# Patient Record
Sex: Male | Born: 1985
Health system: Southern US, Community
[De-identification: ages and names within clinical notes are randomized; demographics above are authoritative.]

## PROBLEM LIST (undated history)

## (undated) HISTORY — PX: HERNIA REPAIR: SHX51

## (undated) HISTORY — PX: ORIF TIBIA FRACTURE: SHX5416

---

## 2011-12-22 ENCOUNTER — Emergency Department (HOSPITAL_BASED_OUTPATIENT_CLINIC_OR_DEPARTMENT_OTHER)
Admission: EM | Admit: 2011-12-22 | Discharge: 2011-12-22 | Disposition: A | Discharge status: Home / Self Care | Payer: Self-pay | Attending: Emergency Medicine | Admitting: Emergency Medicine

## 2011-12-22 ENCOUNTER — Encounter (HOSPITAL_BASED_OUTPATIENT_CLINIC_OR_DEPARTMENT_OTHER): Payer: Self-pay | Admitting: *Deleted

## 2011-12-22 DIAGNOSIS — L089 Local infection of the skin and subcutaneous tissue, unspecified: Secondary | ICD-10-CM | POA: Insufficient documentation

## 2011-12-22 DIAGNOSIS — B86 Scabies: Secondary | ICD-10-CM | POA: Insufficient documentation

## 2011-12-22 DIAGNOSIS — F17200 Nicotine dependence, unspecified, uncomplicated: Secondary | ICD-10-CM | POA: Insufficient documentation

## 2011-12-22 MED ORDER — HYDROCODONE-ACETAMINOPHEN 5-325 MG PO TABS: 2.0000 | ORAL_TABLET | ORAL | Status: DC | PRN

## 2011-12-22 MED ORDER — DOXYCYCLINE HYCLATE 100 MG PO CAPS: 100.0000 mg | ORAL_CAPSULE | Freq: Two times a day (BID) | ORAL | Status: DC

## 2011-12-22 MED ORDER — DOXYCYCLINE HYCLATE 100 MG PO CAPS: 100.0000 mg | ORAL_CAPSULE | Freq: Two times a day (BID) | ORAL | Status: AC

## 2011-12-22 MED ORDER — SULFAMETHOXAZOLE-TRIMETHOPRIM 800-160 MG PO TABS: 1.0000 | ORAL_TABLET | Freq: Two times a day (BID) | ORAL | Status: DC

## 2011-12-22 MED ORDER — PERMETHRIN 5 % EX CREA: TOPICAL_CREAM | CUTANEOUS | Status: AC

## 2011-12-22 NOTE — ED Provider Notes (Signed)
History     CSN: [id number]  Arrival date & time 12/22/11  1102   First MD Initiated Contact with Patient 12/22/11 1227      Chief Complaint  Patient presents with  . Insect Bite  . Rash    (Consider location/radiation/quality/duration/timing/severity/associated sxs/prior treatment) Patient is a 26 y.o. male presenting with rash. The history is provided by the patient. No language interpreter was used.  Rash  This is a new problem. The current episode started more than 2 days ago. The problem has not changed since onset.The problem is associated with nothing. There has been no fever. The rash is present on the neck. The pain is at a severity of 6/10. The pain is moderate. The pain has been constant since onset. Associated symptoms include blisters and itching. He has tried antihistamines and anti-itch cream for the symptoms. The treatment provided mild relief.    History reviewed. No pertinent past medical history.  History reviewed. No pertinent past surgical history.  History reviewed. No pertinent family history.  History  Substance Use Topics  . Smoking status: Current Everyday Smoker  . Smokeless tobacco: Not on file  . Alcohol Use: Yes     occ      Review of Systems  Skin: Positive for itching and rash.  All other systems reviewed and are negative.    Allergies  Review of patient's allergies indicates no known allergies.  Home Medications   Current Outpatient Rx  Name Route Sig Dispense Refill  . DOXYCYCLINE HYCLATE 100 MG PO CAPS Oral Take 1 capsule (100 mg total) by mouth 2 (two) times daily. 20 capsule 0  . HYDROCODONE-ACETAMINOPHEN 5-325 MG PO TABS Oral Take 2 tablets by mouth every 4 (four) hours as needed for pain. 16 tablet 0  . PERMETHRIN 5 % EX CREA  Apply to affected area once 60 g 0    BP 123/70  Pulse 65  Temp(Src) 97.8 F (36.6 C) (Oral)  Resp 20  Ht 5\' 7"  (1.702 m)  Wt 154 lb 7 oz (70.052 kg)  BMI 24.19 kg/m2  SpO2 100%  Physical  Exam  Nursing note and vitals reviewed. Constitutional: He is oriented to person, place, and time. He appears well-developed and well-nourished.  HENT:  Head: Normocephalic and atraumatic.  Right Ear: External ear normal.  Left Ear: External ear normal.  Nose: Nose normal.  Mouth/Throat: Oropharynx is clear and moist.  Eyes: Conjunctivae and EOM are normal. Pupils are equal, round, and reactive to light.  Neck: Normal range of motion. Neck supple.  Cardiovascular: Normal rate.   Pulmonary/Chest: Effort normal.  Abdominal: Soft.  Musculoskeletal: Normal range of motion.  Neurological: He is alert and oriented to person, place, and time.  Skin: There is erythema.  Psychiatric: He has a normal mood and affect.    ED Course  Procedures (including critical care time)  Labs Reviewed - No data to display No results found.   1. Scabies   2. Skin infection       MDM  Pt given rx for elemite, hydrocodone and bactrim        Langston Masker, [place] 12/22/11 1248

## 2011-12-22 NOTE — Discharge Instructions (Signed)
Scabies Scabies are small bugs (mites) that burrow under the skin and cause red bumps and severe itching. These bugs can only be seen with a microscope. Scabies are highly contagious. They can spread easily from person to person by direct contact. They are also spread through sharing clothing or linens that have the scabies mites living in them. It is not unusual for an entire family to become infected through shared towels, clothing, or bedding.  HOME CARE INSTRUCTIONS   Your caregiver may prescribe a cream or lotion to kill the mites. If this cream is prescribed; massage the cream into the entire area of the body from the neck to the bottom of both feet. Also massage the cream into the scalp and face if your child is less than 13 year old. Avoid the eyes and mouth.   Leave the cream on for 8 to12 hours. Do not wash your hands after application. Your child should bathe or shower after the 8 to 12 hour application period. Sometimes it is helpful to apply the cream to your child at right before bedtime.   One treatment is usually effective and will eliminate approximately 95% of infestations. For severe cases, your caregiver may decide to repeat the treatment in 1 week. Everyone in your household should be treated with one application of the cream.   New rashes or burrows should not appear after successful treatment within 24 to 48 hours; however the itching and rash may last for 2 to 4 weeks after successful treatment. If your symptoms persist longer than this, see your caregiver.   Your caregiver also may prescribe a medication to help with the itching or to help the rash go away more quickly.   Scabies can live on clothing or linens for up to 3 days. Your entire child's recently used clothing, towels, stuffed toys, and bed linens should be washed in hot water and then dried in a dryer for at least 20 minutes on high heat. Items that cannot be washed should be enclosed in a plastic bag for at least 3  days.   To help relieve itching, bathe your child in a cool bath or apply cool washcloths to the affected areas.   Your child may return to school after treatment with the prescribed cream.  SEEK MEDICAL CARE IF:   The itching persists longer than 4 weeks after treatment.   The rash spreads or becomes infected (the area has red blisters or yellow-tan crust).  Document Released: 10/18/2005 Document Revised: 06/30/2011 Document Reviewed: 02/26/2009 [facility] Patient Information 2012 [place], [place].Skin Infections A skin infection usually develops as a result of disruption of the skin barrier.  CAUSES  A skin infection might occur following:  Trauma or an injury to the skin such as a cut or insect sting.   Inflammation (as in eczema).   Breaks in the skin between the toes (as in athlete's foot).   Swelling (edema).  SYMPTOMS  The legs are the most common site affected. Usually there is:  Redness.   Swelling.   Pain.   There may be red streaks in the area of the infection.  TREATMENT   Minor skin infections may be treated with topical antibiotics, but if the skin infection is severe, hospital care and intravenous (IV) antibiotic treatment may be needed.   Most often skin infections can be treated with oral antibiotic medicine as well as proper rest and elevation of the affected area until the infection improves.   If you are  prescribed oral antibiotics, it is important to take them as directed and to take all the pills even if you feel better before you have finished all of the medicine.   You may apply warm compresses to the area for 20-30 minutes 4 times daily.  You might need a tetanus shot now if:  You have no idea when you had the last one.   You have never had a tetanus shot before.   Your wound had dirt in it.  If you need a tetanus shot and you decide not to get one, there is a rare chance of getting tetanus. Sickness from tetanus can be serious. If you get a  tetanus shot, your arm may swell and become red and warm at the shot site. This is common and not a problem. SEEK MEDICAL CARE IF:  The pain and swelling from your infection do not improve within 2 days.  SEEK IMMEDIATE MEDICAL CARE IF:  You develop a fever, chills, or other serious problems.  Document Released: 11/25/2004 Document Revised: 06/30/2011 Document Reviewed: 10/07/2008 [facility] Patient Information 2012 [place], [place].

## 2011-12-22 NOTE — ED Notes (Signed)
Swollen area on left side of neck no drainage is painful also has red rash that itches on arms and legs x 2.5 weeks

## 2011-12-22 NOTE — ED Notes (Signed)
Pt asking about the wait to be seen by physician. Offered blankets and tv  Explained the delay assured pt he will be seen soon

## 2011-12-23 NOTE — ED Provider Notes (Signed)
History/physical exam/procedure(s) were performed by non-physician practitioner and as supervising physician I was immediately available for consultation/collaboration. I have reviewed all notes and am in agreement with care and plan.   Hilario Quarry, MD 12/23/11 1257

## 2011-12-27 ENCOUNTER — Encounter (HOSPITAL_BASED_OUTPATIENT_CLINIC_OR_DEPARTMENT_OTHER): Payer: Self-pay | Admitting: *Deleted

## 2011-12-27 ENCOUNTER — Emergency Department (HOSPITAL_BASED_OUTPATIENT_CLINIC_OR_DEPARTMENT_OTHER)
Admission: EM | Admit: 2011-12-27 | Discharge: 2011-12-27 | Disposition: A | Discharge status: Home / Self Care | Payer: Self-pay | Attending: Emergency Medicine | Admitting: Emergency Medicine

## 2011-12-27 DIAGNOSIS — L03221 Cellulitis of neck: Secondary | ICD-10-CM | POA: Insufficient documentation

## 2011-12-27 DIAGNOSIS — R591 Generalized enlarged lymph nodes: Secondary | ICD-10-CM

## 2011-12-27 DIAGNOSIS — R599 Enlarged lymph nodes, unspecified: Secondary | ICD-10-CM | POA: Insufficient documentation

## 2011-12-27 DIAGNOSIS — L0211 Cutaneous abscess of neck: Secondary | ICD-10-CM | POA: Insufficient documentation

## 2011-12-27 DIAGNOSIS — M542 Cervicalgia: Secondary | ICD-10-CM | POA: Insufficient documentation

## 2011-12-27 DIAGNOSIS — L0291 Cutaneous abscess, unspecified: Secondary | ICD-10-CM

## 2011-12-27 DIAGNOSIS — R220 Localized swelling, mass and lump, head: Secondary | ICD-10-CM | POA: Insufficient documentation

## 2011-12-27 MED ORDER — LIDOCAINE-EPINEPHRINE 2 %-1:100000 IJ SOLN
INTRAMUSCULAR | Status: AC
  Filled 2011-12-27: qty 1

## 2011-12-27 MED ORDER — OXYCODONE-ACETAMINOPHEN 5-325 MG PO TABS: 2.0000 | ORAL_TABLET | Freq: Once | ORAL | Status: AC

## 2011-12-27 MED ORDER — DOXYCYCLINE MONOHYDRATE 100 MG PO CAPS: 100.0000 mg | ORAL_CAPSULE | Freq: Two times a day (BID) | ORAL | Status: AC

## 2011-12-27 MED ORDER — DOXYCYCLINE MONOHYDRATE 100 MG PO CAPS: 100.0000 mg | ORAL_CAPSULE | Freq: Two times a day (BID) | ORAL | Status: DC

## 2011-12-27 MED ORDER — OXYCODONE-ACETAMINOPHEN 5-325 MG PO TABS: 1.0000 | ORAL_TABLET | ORAL | Status: AC | PRN

## 2011-12-27 MED ORDER — OXYCODONE-ACETAMINOPHEN 5-325 MG PO TABS: 1.0000 | ORAL_TABLET | ORAL | Status: DC | PRN

## 2011-12-27 MED ORDER — OXYCODONE-ACETAMINOPHEN 5-325 MG PO TABS
2.0000 | ORAL_TABLET | Freq: Once | ORAL | Status: AC
  Administered 2011-12-27: 2 via ORAL
  Filled 2011-12-27: qty 2

## 2011-12-27 NOTE — Discharge Instructions (Signed)
Abscess An abscess (boil or furuncle) is an infected area that contains a collection of pus.  SYMPTOMS Signs and symptoms of an abscess include pain, tenderness, redness, or hardness. You may feel a moveable soft area under your skin. An abscess can occur anywhere in the body.  TREATMENT  A surgical cut (incision) may be made over your abscess to drain the pus. Gauze may be packed into the space or a drain may be looped through the abscess cavity (pocket). This provides a drain that will allow the cavity to heal from the inside outwards. The abscess may be painful for a few days, but should feel much better if it was drained.  Your abscess, if seen early, may not have localized and may not have been drained. If not, another appointment may be required if it does not get better on its own or with medications. HOME CARE INSTRUCTIONS   Only take over-the-counter or prescription medicines for pain, discomfort, or fever as directed by your caregiver.   Take your antibiotics as directed if they were prescribed. Finish them even if you start to feel better.   Keep the skin and clothes clean around your abscess.   If the abscess was drained, you will need to use gauze dressing to collect any draining pus. Dressings will typically need to be changed 3 or more times a day.   The infection may spread by skin contact with others. Avoid skin contact as much as possible.   Practice good hygiene. This includes regular hand washing, cover any draining skin lesions, and do not share personal care items.   If you participate in sports, do not share athletic equipment, towels, whirlpools, or personal care items. Shower after every practice or tournament.   If a draining area cannot be adequately covered:   Do not participate in sports.   Children should not participate in day care until the wound has healed or drainage stops.   If your caregiver has given you a follow-up appointment, it is very important  to keep that appointment. Not keeping the appointment could result in a much worse infection, chronic or permanent injury, pain, and disability. If there is any problem keeping the appointment, you must call back to this facility for assistance.  SEEK MEDICAL CARE IF:   You develop increased pain, swelling, redness, drainage, or bleeding in the wound site.   You develop signs of generalized infection including muscle aches, chills, fever, or a general ill feeling.   You have an oral temperature above 102 F (38.9 C).  MAKE SURE YOU:   Understand these instructions.   Will watch your condition.   Will get help right away if you are not doing well or get worse.  Document Released: 07/28/2005 Document Revised: 06/30/2011 Document Reviewed: 05/21/2008 [facility] Patient Information 2012 [place], [place].Lymphadenopathy Lymphadenopathy means "disease of the lymph glands." But the term is usually used to describe swollen or enlarged lymph glands, also called lymph nodes. These are the bean-shaped organs found in many locations including the neck, underarm, and groin. Lymph glands are part of the immune system, which fights infections in your body. Lymphadenopathy can occur in just one area of the body, such as the neck, or it can be generalized, with lymph node enlargement in several areas. The nodes found in the neck are the most common sites of lymphadenopathy. CAUSES  When your immune system responds to germs (such as viruses or bacteria ), infection-fighting cells and fluid build up. This causes  the glands to grow in size. This is usually not something to worry about. Sometimes, the glands themselves can become infected and inflamed. This is called lymphadenitis. Enlarged lymph nodes can be caused by many diseases:  Bacterial disease, such as strep throat or a skin infection.   Viral disease, such as a common cold.   Other germs, such as lyme disease, tuberculosis, or sexually transmitted  diseases.   Cancers, such as lymphoma (cancer of the lymphatic system) or leukemia (cancer of the white blood cells).   Inflammatory diseases such as lupus or rheumatoid arthritis.   Reactions to medications.  Many of the diseases above are rare, but important. This is why you should see your caregiver if you have lymphadenopathy. SYMPTOMS   Swollen, enlarged lumps in the neck, back of the head or other locations.   Tenderness.   Warmth or redness of the skin over the lymph nodes.   Fever.  DIAGNOSIS  Enlarged lymph nodes are often near the source of infection. They can help healthcare providers diagnose your illness. For instance:   Swollen lymph nodes around the jaw might be caused by an infection in the mouth.   Enlarged glands in the neck often signal a throat infection.   Lymph nodes that are swollen in more than one area often indicate an illness caused by a virus.  Your caregiver most likely will know what is causing your lymphadenopathy after listening to your history and examining you. Blood tests, x-rays or other tests may be needed. If the cause of the enlarged lymph node cannot be found, and it does not go away by itself, then a biopsy may be needed. Your caregiver will discuss this with you. TREATMENT  Treatment for your enlarged lymph nodes will depend on the cause. Many times the nodes will shrink to normal size by themselves, with no treatment. Antibiotics or other medicines may be needed for infection. Only take over-the-counter or prescription medicines for pain, discomfort or fever as directed by your caregiver. HOME CARE INSTRUCTIONS  Swollen lymph glands usually return to normal when the underlying medical condition goes away. If they persist, contact your health-care provider. He/she might prescribe antibiotics or other treatments, depending on the diagnosis. Take any medications exactly as prescribed. Keep any follow-up appointments made to check on the condition  of your enlarged nodes.  SEEK MEDICAL CARE IF:   Swelling lasts for more than two weeks.   You have symptoms such as weight loss, night sweats, fatigue or fever that does not go away.   The lymph nodes are hard, seem fixed to the skin or are growing rapidly.   Skin over the lymph nodes is red and inflamed. This could mean there is an infection.  SEEK IMMEDIATE MEDICAL CARE IF:   Fluid starts leaking from the area of the enlarged lymph node.   You develop a fever of 102 F (38.9 C) or greater.   Severe pain develops (not necessarily at the site of a large lymph node).   You develop chest pain or shortness of breath.   You develop worsening abdominal pain.  MAKE SURE YOU:   Understand these instructions.   Will watch your condition.   Will get help right away if you are not doing well or get worse.  Document Released: 07/27/2008 Document Revised: 06/30/2011 Document Reviewed: 07/27/2008 [facility] Patient Information 2012 [place], [place].

## 2011-12-27 NOTE — ED Provider Notes (Addendum)
History     CSN: [id number]  Arrival date & time 12/27/11  2000   First MD Initiated Contact with Patient 12/27/11 2032      No chief complaint on file.   (Consider location/radiation/quality/duration/timing/severity/associated sxs/prior treatment) HPI  History reviewed. No pertinent past medical history. Patient with tender swollen area to left posterior neck.  Patient seen here one week for same and placed on doxycycline.  Patient with increased swelling and pain and noted discharge two days ago.  Patient states he has similar area under left chin which has been present for two years and was drained once.  He has not had fever, chills, fb, numbness or tingling.   Past Surgical History  Procedure Date  . Hernia repair     No family history on file.  History  Substance Use Topics  . Smoking status: Current Everyday Smoker  . Smokeless tobacco: Not on file  . Alcohol Use: Yes     occ      Review of Systems  All other systems reviewed and are negative.    Allergies  Review of patient's allergies indicates no known allergies.  Home Medications   Current Outpatient Rx  Name Route Sig Dispense Refill  . DOXYCYCLINE HYCLATE 100 MG PO CAPS Oral Take 1 capsule (100 mg total) by mouth 2 (two) times daily. 20 capsule 0  . HYDROCODONE-ACETAMINOPHEN 5-325 MG PO TABS Oral Take 2 tablets by mouth every 4 (four) hours as needed. For pain      BP 145/73  Temp(Src) 98.2 F (36.8 C) (Oral)  Resp 16  SpO2 100%  Physical Exam  Vitals reviewed. Constitutional: He is oriented to person, place, and time. He appears well-developed and well-nourished.  HENT:  Head: Normocephalic and atraumatic.  Right Ear: External ear normal.  Left Ear: External ear normal.  Eyes: Pupils are equal, round, and reactive to light.  Neck: Normal range of motion. Neck supple.         Tender swollen 4x 3 cm left posterior neck swelling. 2x2 cm left submandibular node without fluctuance    Cardiovascular: Normal rate.   Pulmonary/Chest: Effort normal.  Abdominal: Soft.  Musculoskeletal: Normal range of motion.  Neurological: He is alert and oriented to person, place, and time.  Skin: Skin is warm and dry.  Psychiatric: He has a normal mood and affect.    ED Course  Procedures (including critical care time)  Labs Reviewed - No data to display No results found.   No diagnosis found.    MDM  INCISION AND DRAINAGE Performed by: Hilario Quarry Consent: Verbal consent obtained. Risks and benefits: risks, benefits and alternatives were discussed Type: abscess  Body area: neck  Anesthesia: local infiltration  Local anesthetic: lidocaine 1% with epinephrine  Anesthetic total: 1 ml  Complexity: complex Blunt dissection to break up loculations  Drainage: purulent  Drainage amount: copious    Patient tolerance: Patient tolerated the procedure well with no immediate complications.         Hilario Quarry, MD 12/27/11 4098  Hilario Quarry, MD 12/27/11 2101

## 2011-12-27 NOTE — ED Notes (Signed)
Was seen here 1 week ago for cyst on his neck and been given abx for cyst on his left neck.  Cyst is worse now and more painful.

## 2013-01-08 ENCOUNTER — Emergency Department (HOSPITAL_BASED_OUTPATIENT_CLINIC_OR_DEPARTMENT_OTHER)
Admission: EM | Admit: 2013-01-08 | Discharge: 2013-01-08 | Disposition: A | Discharge status: Home / Self Care | Payer: Self-pay | Attending: Emergency Medicine | Admitting: Emergency Medicine

## 2013-01-08 ENCOUNTER — Encounter (HOSPITAL_BASED_OUTPATIENT_CLINIC_OR_DEPARTMENT_OTHER): Payer: Self-pay | Admitting: Family Medicine

## 2013-01-08 DIAGNOSIS — K029 Dental caries, unspecified: Secondary | ICD-10-CM | POA: Insufficient documentation

## 2013-01-08 DIAGNOSIS — Z79899 Other long term (current) drug therapy: Secondary | ICD-10-CM | POA: Insufficient documentation

## 2013-01-08 DIAGNOSIS — F17200 Nicotine dependence, unspecified, uncomplicated: Secondary | ICD-10-CM | POA: Insufficient documentation

## 2013-01-08 DIAGNOSIS — K089 Disorder of teeth and supporting structures, unspecified: Secondary | ICD-10-CM | POA: Insufficient documentation

## 2013-01-08 DIAGNOSIS — K047 Periapical abscess without sinus: Secondary | ICD-10-CM | POA: Insufficient documentation

## 2013-01-08 MED ORDER — AMOXICILLIN 500 MG PO CAPS: 500.0000 mg | ORAL_CAPSULE | Freq: Three times a day (TID) | ORAL | Status: DC

## 2013-01-08 MED ORDER — HYDROCODONE-ACETAMINOPHEN 5-325 MG PO TABS: 1.0000 | ORAL_TABLET | Freq: Four times a day (QID) | ORAL | Status: DC | PRN

## 2013-01-08 NOTE — ED Provider Notes (Signed)
History     CSN: [id number]  Arrival date & time 01/08/13  1030   First MD Initiated Contact with Patient 01/08/13 1125      Chief Complaint  Patient presents with  . Dental Pain    (Consider location/radiation/quality/duration/timing/severity/associated sxs/prior treatment) Patient is a 27 y.o. male presenting with tooth pain. The history is provided by the patient.  Dental PainPrimary symptoms do not include headaches, fever or sore throat.  pt c/o left upper dental pain, gum swelling. Pain constant, mod-sev, dull, worse w eating at times. No fever or chills. No trouble breathing or swallowing. No injury to area but states has several bad teeth. No pain or swelling to throat, neck or floor of mouth.     History reviewed. No pertinent past medical history.  Past Surgical History  Procedure Laterality Date  . Hernia repair      No family history on file.  History  Substance Use Topics  . Smoking status: Current Every Day Smoker  . Smokeless tobacco: Not on file  . Alcohol Use: Yes     Comment: occ      Review of Systems  Constitutional: Negative for fever.  HENT: Negative for sore throat.   Gastrointestinal: Negative for nausea and vomiting.  Neurological: Negative for headaches.    Allergies  Review of patient's allergies indicates no known allergies.  Home Medications   Current Outpatient Rx  Name  Route  Sig  Dispense  Refill  . AMOXICILLIN ER PO   Oral   Take by mouth.         . traMADol (ULTRAM) 50 MG tablet   Oral   Take 50 mg by mouth every 6 (six) hours as needed for pain.           BP 121/73  Pulse 59  Temp(Src) 98.2 F (36.8 C) (Oral)  Resp 16  SpO2 100%  Physical Exam  Nursing note and vitals reviewed. Constitutional: He is oriented to person, place, and time. He appears well-developed and well-nourished. No distress.  HENT:  Head: Atraumatic.  Nose: Nose normal.  Mouth/Throat: Oropharynx is clear and moist.  Several  decayed, broken off teeth. Left upper molar tenderness, decay, associated gum swelling. No trismus. No pharyngeal swelling. No swelling or tenderness to floor of mouth.    Eyes: Conjunctivae are normal. No scleral icterus.  Neck: Normal range of motion. Neck supple. No tracheal deviation present.  Cardiovascular: Normal rate.   Pulmonary/Chest: Effort normal. No accessory muscle usage. No respiratory distress.  Musculoskeletal: He exhibits no edema.  Lymphadenopathy:    He has no cervical adenopathy.  Neurological: He is alert and oriented to person, place, and time.  Skin: Skin is warm and dry.  Psychiatric: He has a normal mood and affect.    ED Course  Procedures (including critical care time)     MDM  Pt drove self to ed on scooter.  will give rx for home, discussed dental follow up.  rx norco and amox, confirmed nkda w pt.           Suzi Roots, MD 01/08/13 1140

## 2013-01-08 NOTE — ED Notes (Signed)
Pt c/o dental pain for "a while". Pt was seen and prescribed amoxicillin and tramadol at [facility] ED last week for same.

## 2013-02-26 ENCOUNTER — Emergency Department (HOSPITAL_BASED_OUTPATIENT_CLINIC_OR_DEPARTMENT_OTHER)
Admission: EM | Admit: 2013-02-26 | Discharge: 2013-02-26 | Disposition: A | Discharge status: Home / Self Care | Payer: Self-pay | Attending: Emergency Medicine | Admitting: Emergency Medicine

## 2013-02-26 ENCOUNTER — Encounter (HOSPITAL_BASED_OUTPATIENT_CLINIC_OR_DEPARTMENT_OTHER): Payer: Self-pay | Admitting: *Deleted

## 2013-02-26 DIAGNOSIS — K029 Dental caries, unspecified: Secondary | ICD-10-CM | POA: Insufficient documentation

## 2013-02-26 DIAGNOSIS — K0889 Other specified disorders of teeth and supporting structures: Secondary | ICD-10-CM

## 2013-02-26 DIAGNOSIS — F17200 Nicotine dependence, unspecified, uncomplicated: Secondary | ICD-10-CM | POA: Insufficient documentation

## 2013-02-26 DIAGNOSIS — K089 Disorder of teeth and supporting structures, unspecified: Secondary | ICD-10-CM | POA: Insufficient documentation

## 2013-02-26 MED ORDER — HYDROCODONE-ACETAMINOPHEN 5-325 MG PO TABS: 1.0000 | ORAL_TABLET | ORAL | Status: DC | PRN

## 2013-02-26 MED ORDER — PENICILLIN V POTASSIUM 500 MG PO TABS: 500.0000 mg | ORAL_TABLET | Freq: Three times a day (TID) | ORAL | Status: DC

## 2013-02-26 MED ORDER — IBUPROFEN 800 MG PO TABS: 800.0000 mg | ORAL_TABLET | Freq: Three times a day (TID) | ORAL | Status: DC

## 2013-02-26 NOTE — ED Notes (Signed)
Pt amb to room 11 with quick steady gait in nad. Pt reports 2 days of tooth pain, states he has no insurance and has not called a Education officer, community.

## 2013-02-26 NOTE — ED Provider Notes (Signed)
History     CSN: [id number]  Arrival date & time 02/26/13  1011   First MD Initiated Contact with Patient 02/26/13 1020      Chief Complaint  Patient presents with  . Dental Pain    (Consider location/radiation/quality/duration/timing/severity/associated sxs/prior treatment) Patient is a 27 y.o. male presenting with tooth pain. The history is provided by the patient.  Dental PainThe primary symptoms include mouth pain. Primary symptoms do not include fever.  Additional symptoms do not include: facial swelling. Associated symptoms comments: Recurrent dental pain without new injury or known fracture. No facial swelling or fever. Marland Kitchen    History reviewed. No pertinent past medical history.  Past Surgical History  Procedure Laterality Date  . Hernia repair      History reviewed. No pertinent family history.  History  Substance Use Topics  . Smoking status: Current Every Day Smoker  . Smokeless tobacco: Not on file  . Alcohol Use: Yes     Comment: occ      Review of Systems  Constitutional: Negative for fever.  HENT: Positive for dental problem. Negative for facial swelling.     Allergies  Review of patient's allergies indicates no known allergies.  Home Medications   Current Outpatient Rx  Name  Route  Sig  Dispense  Refill  . amoxicillin (AMOXIL) 500 MG capsule   Oral   Take 1 capsule (500 mg total) by mouth 3 (three) times daily.   21 capsule   0   . AMOXICILLIN ER PO   Oral   Take by mouth.         Marland Kitchen HYDROcodone-acetaminophen (NORCO/VICODIN) 5-325 MG per tablet   Oral   Take 1-2 tablets by mouth every 6 (six) hours as needed for pain.   20 tablet   0   . traMADol (ULTRAM) 50 MG tablet   Oral   Take 50 mg by mouth every 6 (six) hours as needed for pain.           BP 157/84  Pulse 68  Temp(Src) 97.6 F (36.4 C) (Oral)  Resp 18  Ht 5\' 9"  (1.753 m)  Wt 140 lb (63.504 kg)  BMI 20.67 kg/m2  SpO2 100%  Physical Exam  Constitutional: He is  oriented to person, place, and time. He appears well-developed and well-nourished. No distress.  HENT:  Diffuse dental caries with upper right 2nd molar partially missing. No pulp exposure. No facial swelling. No cervical adenopathy.  Pulmonary/Chest: Effort normal.  Neurological: He is alert and oriented to person, place, and time.    ED Course  Procedures (including critical care time)  Labs Reviewed - No data to display No results found.   No diagnosis found.  1. Dental pain   MDM  Recurrent dental pain. He admits to poor dental care and to not taking abx prescribed to him last ED visit (01/08/13) as he was supposed to. He is a smoker. He has not followed up with a dentist for further care. Encouraged dental follow up with discussion on narcotic prescription limitations through ED.        Arnoldo Hooker, PA-C 02/26/13 1037

## 2013-02-27 NOTE — ED Provider Notes (Signed)
Medical screening examination/treatment/procedure(s) were performed by non-physician practitioner and as supervising physician I was immediately available for consultation/collaboration.    Shelda Jakes, MD 02/27/13 [phone]

## 2013-03-01 ENCOUNTER — Emergency Department (HOSPITAL_BASED_OUTPATIENT_CLINIC_OR_DEPARTMENT_OTHER)
Admission: EM | Admit: 2013-03-01 | Discharge: 2013-03-01 | Disposition: A | Discharge status: Home / Self Care | Payer: Self-pay | Attending: Emergency Medicine | Admitting: Emergency Medicine

## 2013-03-01 ENCOUNTER — Encounter (HOSPITAL_BASED_OUTPATIENT_CLINIC_OR_DEPARTMENT_OTHER): Payer: Self-pay | Admitting: Emergency Medicine

## 2013-03-01 DIAGNOSIS — F17200 Nicotine dependence, unspecified, uncomplicated: Secondary | ICD-10-CM | POA: Insufficient documentation

## 2013-03-01 DIAGNOSIS — K047 Periapical abscess without sinus: Secondary | ICD-10-CM | POA: Insufficient documentation

## 2013-03-01 MED ORDER — BUPIVACAINE-EPINEPHRINE PF 0.5-1:200000 % IJ SOLN
3.0000 mL | Freq: Once | INTRAMUSCULAR | Status: AC
  Administered 2013-03-01: 15 mg
  Filled 2013-03-01: qty 3.6

## 2013-03-01 MED ORDER — OXYCODONE-ACETAMINOPHEN 10-325 MG PO TABS: 1.0000 | ORAL_TABLET | ORAL | Status: DC | PRN

## 2013-03-01 MED ORDER — BUPIVACAINE-EPINEPHRINE (PF) 0.5% -1:200000 IJ SOLN
INTRAMUSCULAR | Status: AC
  Administered 2013-03-01: 15 mg
  Filled 2013-03-01: qty 1.8

## 2013-03-01 MED ORDER — BUPIVACAINE-EPINEPHRINE (PF) 0.5% -1:200000 IJ SOLN
INTRAMUSCULAR | Status: AC
  Filled 2013-03-01: qty 1.8

## 2013-03-01 NOTE — ED Notes (Signed)
Pt c/o dental pain. Pt was seen here for same a few weeks ago. Pt states the hydrocodone we gave him isn't helping. Pt states "it's like eating candy".

## 2013-03-01 NOTE — ED Notes (Signed)
Advised pt of going to [facility] for their clinic. Pt states he has heard of this but hasn't had time to go.

## 2013-03-01 NOTE — ED Provider Notes (Addendum)
History     CSN: [id number]  Arrival date & time 03/01/13  7829      Chief Complaint  Patient presents with  . Dental Pain    (Consider location/radiation/quality/duration/timing/severity/associated sxs/prior treatment) HPI This is a 27 year old male with a history of widespread dental decay. This is second visitof the week for dental pain. Since his last visit he states he is been compliant with taking his amoxicillin and hydrocodone but has had worsening pain despite taking these. He is complaining specifically of pain at the right upper first bicuspid. He states the pain is severe, worse with palpation of the gum, eating or drinking. He states the adjacent gum is swollen and he can see a "sore" on the gum. He states he intends to do for the dentures this morning except for 35 walk-in's after 6:30 AM.  History reviewed. No pertinent past medical history.  Past Surgical History  Procedure Laterality Date  . Hernia repair      No family history on file.  History  Substance Use Topics  . Smoking status: Current Every Day Smoker  . Smokeless tobacco: Not on file  . Alcohol Use: Yes     Comment: occ      Review of Systems  All other systems reviewed and are negative.    Allergies  Review of patient's allergies indicates no known allergies.  Home Medications   Current Outpatient Rx  Name  Route  Sig  Dispense  Refill  . amoxicillin (AMOXIL) 500 MG capsule   Oral   Take 1 capsule (500 mg total) by mouth 3 (three) times daily.   21 capsule   0   . AMOXICILLIN ER PO   Oral   Take by mouth.         Marland Kitchen HYDROcodone-acetaminophen (NORCO/VICODIN) 5-325 MG per tablet   Oral   Take 1-2 tablets by mouth every 6 (six) hours as needed for pain.   20 tablet   0   . HYDROcodone-acetaminophen (NORCO/VICODIN) 5-325 MG per tablet   Oral   Take 1 tablet by mouth every 4 (four) hours as needed for pain.   8 tablet   0   . ibuprofen (ADVIL,MOTRIN) 800 MG tablet   Oral   Take 1 tablet (800 mg total) by mouth 3 (three) times daily.   21 tablet   0   . penicillin v potassium (VEETID) 500 MG tablet   Oral   Take 1 tablet (500 mg total) by mouth 3 (three) times daily.   30 tablet   0   . traMADol (ULTRAM) 50 MG tablet   Oral   Take 50 mg by mouth every 6 (six) hours as needed for pain.           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 Adequate analgesia was obtained. The patient tolerated this well and there were no immediate complications.    MDM  The patient was told he would receive a prescription for 6 oxycodone tablets in case he is unable to get in until tomorrow. He was advised that what he needs is definitive dental treatment, not repeated visits to the ED.        Hanley Seamen, MD 03/01/13 1610  Hanley Seamen, MD 03/01/13 9604  Hanley Seamen, MD 03/01/13 5409

## 2013-10-12 ENCOUNTER — Emergency Department (HOSPITAL_BASED_OUTPATIENT_CLINIC_OR_DEPARTMENT_OTHER)
Admission: EM | Admit: 2013-10-12 | Discharge: 2013-10-12 | Disposition: A | Discharge status: Home / Self Care | Payer: Self-pay | Attending: Emergency Medicine | Admitting: Emergency Medicine

## 2013-10-12 ENCOUNTER — Encounter (HOSPITAL_BASED_OUTPATIENT_CLINIC_OR_DEPARTMENT_OTHER): Payer: Self-pay | Admitting: Emergency Medicine

## 2013-10-12 DIAGNOSIS — F17200 Nicotine dependence, unspecified, uncomplicated: Secondary | ICD-10-CM | POA: Insufficient documentation

## 2013-10-12 DIAGNOSIS — Z792 Long term (current) use of antibiotics: Secondary | ICD-10-CM | POA: Insufficient documentation

## 2013-10-12 DIAGNOSIS — K0889 Other specified disorders of teeth and supporting structures: Secondary | ICD-10-CM

## 2013-10-12 DIAGNOSIS — K089 Disorder of teeth and supporting structures, unspecified: Secondary | ICD-10-CM | POA: Insufficient documentation

## 2013-10-12 DIAGNOSIS — K08109 Complete loss of teeth, unspecified cause, unspecified class: Secondary | ICD-10-CM | POA: Insufficient documentation

## 2013-10-12 DIAGNOSIS — R51 Headache: Secondary | ICD-10-CM | POA: Insufficient documentation

## 2013-10-12 DIAGNOSIS — K5900 Constipation, unspecified: Secondary | ICD-10-CM | POA: Insufficient documentation

## 2013-10-12 DIAGNOSIS — Z791 Long term (current) use of non-steroidal anti-inflammatories (NSAID): Secondary | ICD-10-CM | POA: Insufficient documentation

## 2013-10-12 DIAGNOSIS — R6883 Chills (without fever): Secondary | ICD-10-CM | POA: Insufficient documentation

## 2013-10-12 LAB — RAPID URINE DRUG SCREEN, HOSP PERFORMED
Benzodiazepines: NOT DETECTED
Cocaine: NOT DETECTED

## 2013-10-12 MED ORDER — PENICILLIN V POTASSIUM 500 MG PO TABS: 500.0000 mg | ORAL_TABLET | Freq: Four times a day (QID) | ORAL | Status: DC

## 2013-10-12 MED ORDER — OXYCODONE-ACETAMINOPHEN 5-325 MG PO TABS
1.0000 | ORAL_TABLET | Freq: Once | ORAL | Status: AC
  Administered 2013-10-12: 1 via ORAL
  Filled 2013-10-12: qty 1

## 2013-10-12 MED ORDER — OXYCODONE-ACETAMINOPHEN 5-325 MG PO TABS: 1.0000 | ORAL_TABLET | Freq: Four times a day (QID) | ORAL | Status: DC | PRN

## 2013-10-12 MED ORDER — IBUPROFEN 800 MG PO TABS: 800.0000 mg | ORAL_TABLET | Freq: Three times a day (TID) | ORAL | Status: DC

## 2013-10-12 NOTE — ED Provider Notes (Signed)
CSN: [id number]     Arrival date & time 10/12/13  3474 History   First MD Initiated Contact with Patient 10/12/13 [phone]     Chief Complaint  Patient presents with  . mouth pain    (Consider location/radiation/quality/duration/timing/severity/associated sxs/prior Treatment) HPI Comments: 27 year old male with history of dental cavities and abscess presents with 3 day history of tooth pain.  He has associated headache pain and has felt chills starting last night.  He denies facial swelling.  He has tried OTC dental pain relievers, Tylenol and ibuprofen without any relief.    History reviewed. No pertinent past medical history. Past Surgical History  Procedure Laterality Date  . Hernia repair     History reviewed. No pertinent family history. History  Substance Use Topics  . Smoking status: Current Every Day Smoker  . Smokeless tobacco: Not on file  . Alcohol Use: Yes     Comment: occ    Review of Systems  Constitutional: Positive for chills. Negative for appetite change.  HENT: Positive for dental problem.   Cardiovascular: Negative for chest pain.  Gastrointestinal: Positive for constipation. Negative for diarrhea.  Neurological: Positive for headaches.    Allergies  Review of patient's allergies indicates no known allergies.  Home Medications   Current Outpatient Rx  Name  Route  Sig  Dispense  Refill  . amoxicillin (AMOXIL) 500 MG capsule   Oral   Take 1 capsule (500 mg total) by mouth 3 (three) times daily.   21 capsule   0   . AMOXICILLIN ER PO   Oral   Take by mouth.         Marland Kitchen HYDROcodone-acetaminophen (NORCO/VICODIN) 5-325 MG per tablet   Oral   Take 1-2 tablets by mouth every 6 (six) hours as needed for pain.   20 tablet   0   . HYDROcodone-acetaminophen (NORCO/VICODIN) 5-325 MG per tablet   Oral   Take 1 tablet by mouth every 4 (four) hours as needed for pain.   8 tablet   0   . ibuprofen (ADVIL,MOTRIN) 800 MG tablet   Oral   Take 1 tablet (800  mg total) by mouth 3 (three) times daily.   21 tablet   0   . oxyCODONE-acetaminophen (PERCOCET) 10-325 MG per tablet   Oral   Take 1 tablet by mouth every 4 (four) hours as needed for pain.   6 tablet   0   . penicillin v potassium (VEETID) 500 MG tablet   Oral   Take 1 tablet (500 mg total) by mouth 3 (three) times daily.   30 tablet   0   . traMADol (ULTRAM) 50 MG tablet   Oral   Take 50 mg by mouth every 6 (six) hours as needed for pain.          BP 133/91  Pulse 106  Temp(Src) 97.7 F (36.5 C) (Oral)  Resp 18  Ht 5\' 7"  (1.702 m)  Wt 140 lb (63.504 kg)  BMI 21.92 kg/m2  SpO2 100% Physical Exam  Constitutional: He is oriented to person, place, and time. No distress.  HENT:  Head: Normocephalic and atraumatic.  Mouth/Throat: No oropharyngeal exudate.  Poor dentition; several missing teeth; erythematous gums of the right maxillae above one of the molars; a portion of the molar is partially missing; he has pain when I push the tooth; no swelling or pus noted  Eyes: EOM are normal. Pupils are equal, round, and reactive to light. No scleral  icterus.  Neck: Neck supple.  Cardiovascular: Normal rate, regular rhythm and normal heart sounds.   Pulmonary/Chest: Effort normal and breath sounds normal. No respiratory distress. He has no wheezes. He has no rales.  Abdominal: Soft. Bowel sounds are normal. He exhibits no distension. There is no tenderness.  Musculoskeletal: Normal range of motion.  Neurological: He is alert and oriented to person, place, and time.  Skin: He is not diaphoretic.  Psychiatric: He has a normal mood and affect. His behavior is normal.    ED Course  Procedures (including critical care time) Labs Review Labs Reviewed - No data to display Imaging Review No results found.  EKG Interpretation   None       MDM  27 year old with dental cavities here for tooth pain.   No facial swelling but pain on palpation of affected tooth.    Will  obtain UDS.  If negative can provide pain relief here and short term rx for pain meds to take until he can get to the dentist.  Rx for 7 day course of PCN also provided.  He has been instructed to return to the ED with worse or new symptoms including fever, facial swelling.  He has been instructed to complete antibiotics and to follow-up with a dentist as soon as possible.  He has been provided with a list of dental resources.   Evelena Peat, DO 10/12/13 1235

## 2013-10-12 NOTE — ED Notes (Signed)
3 days of sinus drainage and mouth pain not sure if has a bad tooth or all related to sinuses

## 2013-10-18 NOTE — ED Provider Notes (Signed)
I saw and evaluated the patient, reviewed the resident's note and I agree with the findings and plan.   .Face to face Exam:  General:  Awake HEENT:  Atraumatic Resp:  Normal effort Abd:  Nondistended Neuro:No focal weakness   [facility] Dauphin, MD 10/18/13 1038 

## 2016-03-08 ENCOUNTER — Encounter (HOSPITAL_BASED_OUTPATIENT_CLINIC_OR_DEPARTMENT_OTHER): Payer: Self-pay | Admitting: *Deleted

## 2016-03-08 ENCOUNTER — Emergency Department (HOSPITAL_BASED_OUTPATIENT_CLINIC_OR_DEPARTMENT_OTHER)
Admission: EM | Admit: 2016-03-08 | Discharge: 2016-03-08 | Disposition: A | Discharge status: Home / Self Care | Payer: PRIVATE HEALTH INSURANCE | Attending: Emergency Medicine | Admitting: Emergency Medicine

## 2016-03-08 DIAGNOSIS — K029 Dental caries, unspecified: Secondary | ICD-10-CM | POA: Insufficient documentation

## 2016-03-08 DIAGNOSIS — F17200 Nicotine dependence, unspecified, uncomplicated: Secondary | ICD-10-CM | POA: Insufficient documentation

## 2016-03-08 DIAGNOSIS — K0889 Other specified disorders of teeth and supporting structures: Secondary | ICD-10-CM | POA: Diagnosis present

## 2016-03-08 MED ORDER — OXYCODONE-ACETAMINOPHEN 5-325 MG PO TABS: ORAL_TABLET | ORAL | Status: DC

## 2016-03-08 MED ORDER — BUPIVACAINE-EPINEPHRINE (PF) 0.5% -1:200000 IJ SOLN
1.8000 mL | Freq: Once | INTRAMUSCULAR | Status: AC
  Administered 2016-03-08: 1.8 mL
  Filled 2016-03-08: qty 1.8

## 2016-03-08 NOTE — Discharge Instructions (Signed)
Take percocet for breakthrough pain, do not drink alcohol, drive, care for children or do other critical tasks while taking percocet. ° °Return to the emergency room for fever, change in vision, redness to the face that rapidly spreads towards the eye, nausea or vomiting, difficulty swallowing or shortness of breath. °  °Apply warm compresses to jaw throughout the day.  ° °Take your antibiotics as directed and to the end of the course. DO NOT drink alcohol when taking metronidazole, it will make you very sick!  ° °Followup with a dentist is very important for ongoing evaluation and management of recurrent dental pain. Return to emergency department for emergent changing or worsening symptoms." ° °Low-cost dental clinic: °**[facility]  at [phone]**  ° °You may also call [phone] ° °Dental Assistance °If the dentist on-call cannot see you, please use the resources below: ° ° °Patients with Medicaid: [facility] °[address], [phone] °[address], [phone] ° °If unable to pay, or uninsured, contact [facility] ([phone]) or [facility] ([phone] in [place], [phone] in [place]) to become qualified for the adult dental clinic ° °Other Low-Cost Community Dental Services: °[facility]- [address], [place], [place], [address] °   [phone], Ext. 123 °   2nd and 4th Thursday of the month at 6:30am °   10 clients each day by appointment, can sometimes see walk-in     patients if someone does not show for an appointment °[facility]- [address], [place], [place], [address] °   [phone] °[facility]- [address], [place], [place], [address] °   [phone] ° °[facility]- [phone] °[facility]- [phone] °[facility]- [phone] ° °

## 2016-03-08 NOTE — ED Notes (Signed)
States he has been having a toothache x 2 days.

## 2016-03-08 NOTE — ED Notes (Signed)
PA at bedside for dental block

## 2016-03-08 NOTE — ED Provider Notes (Signed)
CSN: [id number]     Arrival date & time 03/08/16  1823 History   First MD Initiated Contact with Patient 03/08/16 1837     Chief Complaint  Patient presents with  . Dental Pain     (Consider location/radiation/quality/duration/timing/severity/associated sxs/prior Treatment) HPI   Blood pressure 145/91, pulse 52, temperature 98.5 F (36.9 C), temperature source Oral, resp. rate 16, height  (1.702 m), weight 66.679 kg, SpO2 99 %.  Kevin Stout is a 30 y.o. male complaining of severe left upper tooth pain onset 1 week ago, patient was seen by dentist, given prescription for clindamycin and Tylenol No. 3 with little relief, pain has increased steadily over the last 2 days. Has an appointment with dentist tomorrow. Patient denies fever, chills, lip or tongue swelling, difficulty swallowing. He rates his pain at 10 out of 10, no exacerbating or alleviating factors identified.  History reviewed. No pertinent past medical history. Past Surgical History  Procedure Laterality Date  . Hernia repair     No family history on file. Social History  Substance Use Topics  . Smoking status: Current Every Day Smoker  . Smokeless tobacco: None  . Alcohol Use: Yes     Comment: occ    Review of Systems  10 systems reviewed and found to be negative, except as noted in the HPI.   Allergies  Review of patient's allergies indicates no known allergies.  Home Medications   Prior to Admission medications   Medication Sig Start Date End Date Taking? Authorizing Provider  ibuprofen (ADVIL,MOTRIN) 800 MG tablet Take 1 tablet (800 mg total) by mouth 3 (three) times daily. 10/12/13   Yolanda Manges, DO  oxyCODONE-acetaminophen (PERCOCET/ROXICET) 5-325 MG per tablet Take 1 tablet by mouth every 6 (six) hours as needed for severe pain. 10/12/13   Yolanda Manges, DO  penicillin v potassium (VEETID) 500 MG tablet Take 1 tablet (500 mg total) by mouth 4 (four) times daily. 10/12/13   Yolanda Manges, DO    BP 145/91 mmHg  Pulse 52  Temp(Src) 98.5 F (36.9 C) (Oral)  Resp 16  Ht  (1.702 m)  Wt 66.679 kg  BMI 23.02 kg/m2  SpO2 99% Physical Exam  Constitutional: He is oriented to person, place, and time. He appears well-developed and well-nourished. No distress.  HENT:  Head: Normocephalic.  Mouth/Throat: Oropharynx is clear and moist.  Generally poor dentition, no gingival swelling, erythema or tenderness to palpation. Patient is handling their secretions. There is no tenderness to palpation or firmness underneath tongue bilaterally. No trismus.    Eyes: Conjunctivae and EOM are normal. Pupils are equal, round, and reactive to light.  Neck: Normal range of motion.  Cardiovascular: Normal rate and regular rhythm.   Pulmonary/Chest: Effort normal and breath sounds normal. No stridor.  Abdominal: Soft.  Musculoskeletal: Normal range of motion.  Lymphadenopathy:    He has no cervical adenopathy.  Neurological: He is alert and oriented to person, place, and time.  Psychiatric: He has a normal mood and affect.  Nursing note and vitals reviewed.   ED Course  .Nerve Block Date/Time: 03/08/2016 7:27 PM Performed by: Wynetta Emery Authorized by: Wynetta Emery Consent: Verbal consent obtained. Risks and benefits: risks, benefits and alternatives were discussed Consent given by: patient Required items: required blood products, implants, devices, and special equipment available Patient identity confirmed: verbally with patient Time out: Immediately prior to procedure a "time out" was called to verify the correct patient, procedure, equipment, support staff  and site/side marked as required. Indications: pain relief Body area: face/mouth Nerve: anterior superior alveolar Laterality: left Patient sedated: no Patient position: sitting Needle gauge: 27 G Local anesthetic: bupivacaine 0.5% without epinephrine and bupivacaine 0.5% with epinephrine Anesthetic total: 1.8  ml Outcome: pain unchanged Patient tolerance: Patient tolerated the procedure well with no immediate complications   (including critical care time) Labs Review Labs Reviewed - No data to display  Imaging Review No results found. I have personally reviewed and evaluated these images and lab results as part of my medical decision-making.   EKG Interpretation None      MDM   Final diagnoses:  Pain due to dental caries   Filed Vitals:   03/08/16 1834  BP: 145/91  Pulse: 52  Temp: 98.5 F (36.9 C)  TempSrc: Oral  Resp: 16  Height: 5\' 7"  (1.702 m)  Weight: 66.679 kg  SpO2: 99%    Kevin Stout is 30 y.o. male presenting with dental pain. dental pain associated with dental caries but no signs or symptoms of dental abscess. Patient afebrile, non toxic appearing and swallowing secretions well. I gave patient referral to dentist and stressed the importance of dental follow up for definitive management of dental issues. Patient voices understanding and is agreeable to plan.  Patient driving home, pain medication choices in the ED are limited.   Evaluation does not show pathology that would require ongoing emergent intervention or inpatient treatment. Pt is hemodynamically stable and mentating appropriately. Discussed findings and plan with patient/guardian, who agrees with care plan. All questions answered. Return precautions discussed and outpatient follow up given.   New Prescriptions   OXYCODONE-ACETAMINOPHEN (PERCOCET/ROXICET) 5-325 MG TABLET    1 to 2 tabs PO q6hrs  PRN for pain         Wynetta Emeryicole Wynelle Dreier, PA-C 03/08/16 1928  Geoffery Lyonsouglas Delo, MD 03/08/16 2318

## 2016-03-08 NOTE — ED Notes (Signed)
Dental box to bedside.

## 2016-05-03 ENCOUNTER — Encounter (HOSPITAL_BASED_OUTPATIENT_CLINIC_OR_DEPARTMENT_OTHER): Payer: Self-pay | Admitting: *Deleted

## 2016-05-03 ENCOUNTER — Emergency Department (HOSPITAL_BASED_OUTPATIENT_CLINIC_OR_DEPARTMENT_OTHER)
Admission: EM | Admit: 2016-05-03 | Discharge: 2016-05-03 | Disposition: A | Discharge status: Home / Self Care | Payer: 59 | Attending: Emergency Medicine | Admitting: Emergency Medicine

## 2016-05-03 DIAGNOSIS — Z791 Long term (current) use of non-steroidal anti-inflammatories (NSAID): Secondary | ICD-10-CM | POA: Insufficient documentation

## 2016-05-03 DIAGNOSIS — K0889 Other specified disorders of teeth and supporting structures: Secondary | ICD-10-CM | POA: Diagnosis present

## 2016-05-03 DIAGNOSIS — K029 Dental caries, unspecified: Secondary | ICD-10-CM | POA: Diagnosis not present

## 2016-05-03 DIAGNOSIS — F17210 Nicotine dependence, cigarettes, uncomplicated: Secondary | ICD-10-CM | POA: Insufficient documentation

## 2016-05-03 MED ORDER — HYDROCODONE-ACETAMINOPHEN 5-325 MG PO TABS: 1.0000 | ORAL_TABLET | Freq: Four times a day (QID) | ORAL | Status: DC | PRN

## 2016-05-03 MED ORDER — PENICILLIN V POTASSIUM 500 MG PO TABS
500.0000 mg | ORAL_TABLET | Freq: Three times a day (TID) | ORAL | Status: DC
Start: 2016-05-03 — End: 2017-02-16

## 2016-05-03 NOTE — ED Notes (Signed)
Upper right dental pain for one week.

## 2016-05-03 NOTE — Discharge Instructions (Signed)
Penicillin as prescribed.  Hydrocodone as prescribed as needed for pain.  Follow-up with your dentist as scheduled. Call on Wednesday to move this appointment up if you are not improving.   Dental Caries Dental caries (also called tooth decay) is the most common oral disease. It can occur at any age but is more common in children and young adults.  HOW DENTAL CARIES DEVELOPS  The process of decay begins when bacteria and foods (particularly sugars and starches) combine in your mouth to produce plaque. Plaque is a substance that sticks to the hard, outer surface of a tooth (enamel). The bacteria in plaque produce acids that attack enamel. These acids may also attack the root surface of a tooth (cementum) if it is exposed. Repeated attacks dissolve these surfaces and create holes in the tooth (cavities). If left untreated, the acids destroy the other layers of the tooth.  RISK FACTORS  Frequent sipping of sugary beverages.   Frequent snacking on sugary and starchy foods, especially those that easily get stuck in the teeth.   Poor oral hygiene.   Dry mouth.   Substance abuse such as methamphetamine abuse.   Broken or poor-fitting dental restorations.   Eating disorders.   Gastroesophageal reflux disease (GERD).   Certain radiation treatments to the head and neck. SYMPTOMS In the early stages of dental caries, symptoms are seldom present. Sometimes white, chalky areas may be seen on the enamel or other tooth layers. In later stages, symptoms may include:  Pits and holes on the enamel.  Toothache after sweet, hot, or cold foods or drinks are consumed.  Pain around the tooth.  Swelling around the tooth. DIAGNOSIS  Most of the time, dental caries is detected during a regular dental checkup. A diagnosis is made after a thorough medical and dental history is taken and the surfaces of your teeth are checked for signs of dental caries. Sometimes special instruments, such as  lasers, are used to check for dental caries. Dental X-ray exams may be taken so that areas not visible to the eye (such as between the contact areas of the teeth) can be checked for cavities.  TREATMENT  If dental caries is in its early stages, it may be reversed with a fluoride treatment or an application of a remineralizing agent at the dental office. Thorough brushing and flossing at home is needed to aid these treatments. If it is in its later stages, treatment depends on the location and extent of tooth destruction:   If a small area of the tooth has been destroyed, the destroyed area will be removed and cavities will be filled with a material such as gold, silver amalgam, or composite resin.   If a large area of the tooth has been destroyed, the destroyed area will be removed and a cap (crown) will be fitted over the remaining tooth structure.   If the center part of the tooth (pulp) is affected, a procedure called a root canal will be needed before a filling or crown can be placed.   If most of the tooth has been destroyed, the tooth may need to be pulled (extracted). HOME CARE INSTRUCTIONS You can prevent, stop, or reverse dental caries at home by practicing good oral hygiene. Good oral hygiene includes:  Thoroughly cleaning your teeth at least twice a day with a toothbrush and dental floss.   Using a fluoride toothpaste. A fluoride mouth rinse may also be used if recommended by your dentist or health care provider.  Restricting the amount of sugary and starchy foods and sugary liquids you consume.   Avoiding frequent snacking on these foods and sipping of these liquids.   Keeping regular visits with a dentist for checkups and cleanings. PREVENTION   Practice good oral hygiene.  Consider a dental sealant. A dental sealant is a coating material that is applied by your dentist to the pits and grooves of teeth. The sealant prevents food from being trapped in them. It may  protect the teeth for several years.  Ask about fluoride supplements if you live in a community without fluorinated water or with water that has a low fluoride content. Use fluoride supplements as directed by your dentist or health care provider.  Allow fluoride varnish applications to teeth if directed by your dentist or health care provider.   This information is not intended to replace advice given to you by your health care provider. Make sure you discuss any questions you have with your health care provider.   Document Released: 07/10/2002 Document Revised: 11/08/2014 Document Reviewed: 10/20/2012 Elsevier Interactive Patient Education [employer]2016 [employer]

## 2016-05-03 NOTE — ED Provider Notes (Signed)
CSN: [id number]     Arrival date & time 05/03/16  [id number] History   First MD Initiated Contact with Patient 05/03/16 [phone]     Chief Complaint  Patient presents with  . Dental Pain     (Consider location/radiation/quality/duration/timing/severity/associated sxs/prior Treatment) Patient is a 30 y.o. male presenting with tooth pain. The history is provided by the patient.  Dental Pain Location:  Upper Upper teeth location:  3/RU 1st molar, 2/RU 2nd molar and 1/RU 3rd molar Quality:  Throbbing Severity:  Severe Onset quality:  Sudden Duration:  1 week Timing:  Constant Progression:  Worsening Chronicity:  New Context: poor dentition   Relieved by:  Nothing Worsened by:  Nothing tried Ineffective treatments:  None tried   History reviewed. No pertinent past medical history. Past Surgical History  Procedure Laterality Date  . Hernia repair    . Orif tibia fracture     No family history on file. Social History  Substance Use Topics  . Smoking status: Current Every Day Smoker -- 5.00 packs/day    Types: Cigarettes  . Smokeless tobacco: None  . Alcohol Use: Yes     Comment: occ    Review of Systems  All other systems reviewed and are negative.     Allergies  Review of patient's allergies indicates no known allergies.  Home Medications   Prior to Admission medications   Medication Sig Start Date End Date Taking? Authorizing Provider  ibuprofen (ADVIL,MOTRIN) 800 MG tablet Take 1 tablet (800 mg total) by mouth 3 (three) times daily. 10/12/13  Yes Yolanda MangesAlex M Wilson, DO  oxyCODONE-acetaminophen (PERCOCET/ROXICET) 5-325 MG tablet 1 to 2 tabs PO q6hrs  PRN for pain 03/08/16   Joni ReiningNicole Pisciotta, PA-C  penicillin v potassium (VEETID) 500 MG tablet Take 1 tablet (500 mg total) by mouth 4 (four) times daily. 10/12/13   Yolanda MangesAlex M Wilson, DO   BP 125/71 mmHg  Pulse 55  Temp(Src) 98.1 F (36.7 C) (Oral)  Resp 18  Ht 5\' 7"  (1.702 m)  Wt 140 lb (63.504 kg)  BMI 21.92 kg/m2  SpO2  100% Physical Exam  Constitutional: He is oriented to person, place, and time. He appears well-developed and well-nourished. No distress.  HENT:  Head: Normocephalic and atraumatic.  The patient has heavily decayed dentition throughout. He has multiple missing teeth. The right upper molars are heavily decayed with large pieces of enamel missing. There is no obvious swelling or abscess. There is no stridor and no crepitus.  Neck: Normal range of motion. Neck supple.  Lymphadenopathy:    He has no cervical adenopathy.  Neurological: He is alert and oriented to person, place, and time.  Skin: Skin is warm and dry. He is not diaphoretic.  Nursing note and vitals reviewed.   ED Course  Procedures (including critical care time) Labs Review Labs Reviewed - No data to display  Imaging Review No results found. I have personally reviewed and evaluated these images and lab results as part of my medical decision-making.    MDM   Final diagnoses:  None    Patient presents with dental pain. He has multiple caries and heavily decayed teeth. I see no evidence for abscess. There is no evidence for Ludwig. He'll be treated with antibiotics, pain medication, and follow-up with his dentist. He tells me has an appointment to see them in 1 week. I've advised him to call on Wednesday after the holiday to move this appointment up.    Geoffery Lyonsouglas Lichelle Viets, MD 05/03/16 [phone]

## 2017-02-16 ENCOUNTER — Emergency Department (HOSPITAL_BASED_OUTPATIENT_CLINIC_OR_DEPARTMENT_OTHER)
Admission: EM | Admit: 2017-02-16 | Discharge: 2017-02-16 | Disposition: A | Discharge status: Home / Self Care | Payer: 59 | Attending: Dermatology | Admitting: Dermatology

## 2017-02-16 ENCOUNTER — Encounter (HOSPITAL_BASED_OUTPATIENT_CLINIC_OR_DEPARTMENT_OTHER): Payer: Self-pay

## 2017-02-16 DIAGNOSIS — R350 Frequency of micturition: Secondary | ICD-10-CM | POA: Insufficient documentation

## 2017-02-16 DIAGNOSIS — F17210 Nicotine dependence, cigarettes, uncomplicated: Secondary | ICD-10-CM | POA: Insufficient documentation

## 2017-02-16 DIAGNOSIS — Z5321 Procedure and treatment not carried out due to patient leaving prior to being seen by health care provider: Secondary | ICD-10-CM | POA: Diagnosis not present

## 2017-02-16 LAB — URINALYSIS, ROUTINE W REFLEX MICROSCOPIC
Bilirubin Urine: NEGATIVE
Glucose, UA: NEGATIVE mg/dL
HGB URINE DIPSTICK: NEGATIVE
Ketones, ur: NEGATIVE mg/dL
Leukocytes, UA: NEGATIVE
Nitrite: POSITIVE — AB
PH: 7.5 (ref 5.0–8.0)
Protein, ur: NEGATIVE mg/dL
SPECIFIC GRAVITY, URINE: 1.022 (ref 1.005–1.030)

## 2017-02-16 LAB — URINALYSIS, MICROSCOPIC (REFLEX)
SQUAMOUS EPITHELIAL / LPF: NONE SEEN
WBC UA: NONE SEEN WBC/hpf (ref 0–5)

## 2017-02-16 NOTE — ED Triage Notes (Addendum)
c/o lower back pain x 1 week-c/o urinary freq x 2-3 days-pt states he was seen at urgent care for back pain c/o  x 2 weeks ago-was advised urine had "just a little bit of blood"-pt NAD-steady gait

## 2017-09-02 ENCOUNTER — Emergency Department (HOSPITAL_BASED_OUTPATIENT_CLINIC_OR_DEPARTMENT_OTHER)
Admission: EM | Admit: 2017-09-02 | Discharge: 2017-09-03 | Disposition: A | Discharge status: Home / Self Care | Payer: No Typology Code available for payment source | Attending: Emergency Medicine | Admitting: Emergency Medicine

## 2017-09-02 ENCOUNTER — Encounter (HOSPITAL_BASED_OUTPATIENT_CLINIC_OR_DEPARTMENT_OTHER): Payer: Self-pay | Admitting: *Deleted

## 2017-09-02 ENCOUNTER — Emergency Department (HOSPITAL_BASED_OUTPATIENT_CLINIC_OR_DEPARTMENT_OTHER): Payer: No Typology Code available for payment source

## 2017-09-02 DIAGNOSIS — S39012A Strain of muscle, fascia and tendon of lower back, initial encounter: Secondary | ICD-10-CM | POA: Diagnosis not present

## 2017-09-02 DIAGNOSIS — Y999 Unspecified external cause status: Secondary | ICD-10-CM | POA: Insufficient documentation

## 2017-09-02 DIAGNOSIS — F17210 Nicotine dependence, cigarettes, uncomplicated: Secondary | ICD-10-CM | POA: Insufficient documentation

## 2017-09-02 DIAGNOSIS — Y92410 Unspecified street and highway as the place of occurrence of the external cause: Secondary | ICD-10-CM | POA: Diagnosis not present

## 2017-09-02 DIAGNOSIS — Y939 Activity, unspecified: Secondary | ICD-10-CM | POA: Diagnosis not present

## 2017-09-02 DIAGNOSIS — S3992XA Unspecified injury of lower back, initial encounter: Secondary | ICD-10-CM | POA: Diagnosis present

## 2017-09-02 MED ORDER — ONDANSETRON 8 MG PO TBDP
8.0000 mg | ORAL_TABLET | Freq: Once | ORAL | Status: AC
  Administered 2017-09-02: 8 mg via ORAL
  Filled 2017-09-02: qty 1
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
## 2017-09-02 NOTE — ED Notes (Signed)
Pt returned from radiology.

## 2017-09-03 MED ORDER — HYDROCODONE-ACETAMINOPHEN 5-325 MG PO TABS: 1.0000 | ORAL_TABLET | Freq: Four times a day (QID) | ORAL | 0 refills | Status: DC | PRN

## 2017-09-03 MED ORDER — NAPROXEN 375 MG PO TABS: ORAL_TABLET | ORAL | 0 refills | Status: DC

## 2017-09-03 NOTE — ED Notes (Signed)
Pt verbalizes understanding of d/c instructions and denies any further needs at this time. 

## 2018-08-03 ENCOUNTER — Emergency Department (HOSPITAL_BASED_OUTPATIENT_CLINIC_OR_DEPARTMENT_OTHER)
Admission: EM | Admit: 2018-08-03 | Discharge: 2018-08-03 | Disposition: A | Discharge status: Home / Self Care | Payer: Self-pay | Attending: Emergency Medicine | Admitting: Emergency Medicine

## 2018-08-03 ENCOUNTER — Encounter (HOSPITAL_BASED_OUTPATIENT_CLINIC_OR_DEPARTMENT_OTHER): Payer: Self-pay | Admitting: Emergency Medicine

## 2018-08-03 ENCOUNTER — Other Ambulatory Visit: Payer: Self-pay

## 2018-08-03 ENCOUNTER — Emergency Department (HOSPITAL_BASED_OUTPATIENT_CLINIC_OR_DEPARTMENT_OTHER): Payer: Self-pay

## 2018-08-03 DIAGNOSIS — J069 Acute upper respiratory infection, unspecified: Secondary | ICD-10-CM | POA: Insufficient documentation

## 2018-08-03 DIAGNOSIS — F17210 Nicotine dependence, cigarettes, uncomplicated: Secondary | ICD-10-CM | POA: Insufficient documentation

## 2018-08-03 LAB — GROUP A STREP BY PCR: Group A Strep by PCR: NOT DETECTED

## 2018-08-03 MED ORDER — AZITHROMYCIN 250 MG PO TABS: 250.0000 mg | ORAL_TABLET | Freq: Every day | ORAL | 0 refills | Status: DC

## 2018-08-03 MED FILL — AZITHROMYCIN 250 MG TABLET: 250 | 5 days supply | Qty: 6 | Fill #0

## 2018-08-03 NOTE — ED Triage Notes (Signed)
Reports flu like symptoms, headache, nausea, congestion, facial pain x 1 week.

## 2018-08-03 NOTE — Discharge Instructions (Signed)
You can take Tylenol or Ibuprofen as directed for pain. You can alternate Tylenol and Ibuprofen every 4 hours. If you take Tylenol at 1pm, then you can take Ibuprofen at 5pm. Then you can take Tylenol again at 9pm.   Take antibiotics as directed. Please take all of your antibiotics until finished.  Make sure you drink plenty of fluids and stay hydrated.  Return the emergency department for any persistent fever despite medications, difficulty breathing, vomiting, chest pain, abdominal pain or any other worsening or concerning symptoms.

## 2018-08-03 NOTE — ED Provider Notes (Signed)
[facility] EMERGENCY DEPARTMENT Provider Note   CSN: [id number] Arrival date & time: 08/03/18  1350     History   Chief Complaint Chief Complaint  Patient presents with  . Nasal Congestion    HPI Kevin Stout is a 32 y.o. male who presents for evaluation of nasal congestion, rhinorrhea, sore throat, cough  that has been ongoing for the past week.  Patient reports that he is also had some generalized malaise.  He has not measured any fever.  He states the cough is productive with green mucus.  He reports that today, symptoms worsen.  He reports he felt some nausea and felt lightheaded.  He states that he has not had any vomiting denies any abdominal pain.  He has been able to eat and drink without any difficulty.  He reports that several family members at home have been sick with similar symptoms.  Patient denies any difficulty breathing, chest pain.  Patient reports he does smoke cigarettes.   The history is provided by the patient.    History reviewed. No pertinent past medical history.  There are no active problems to display for this patient.   Past Surgical History:  Procedure Laterality Date  . HERNIA REPAIR    . ORIF TIBIA FRACTURE          Home Medications    Prior to Admission medications   Medication Sig Start Date End Date Taking? Authorizing Provider  azithromycin (ZITHROMAX) 250 MG tablet Take 1 tablet (250 mg total) by mouth daily. Take first 2 tablets together, then 1 every day until finished. 08/03/18   Maxwell Caul, PA-C  HYDROcodone-acetaminophen (NORCO) 5-325 MG tablet Take 1 tablet by mouth every 6 (six) hours as needed for severe pain. 09/03/17   Molpus, John, MD  naproxen (NAPROSYN) 375 MG tablet Take 1 tablet twice daily as needed for pain. 09/03/17   Molpus, Jonny Ruiz, MD    Family History History reviewed. No pertinent family history.  Social History Social History   Tobacco Use  . Smoking status: Current Every Day Smoker   Packs/day: 5.00    Types: Cigarettes  . Smokeless tobacco: Never Used  Substance Use Topics  . Alcohol use: Yes    Comment: occ  . Drug use: No     Allergies   Patient has no known allergies.   Review of Systems Review of Systems  Constitutional: Negative for fever.  HENT: Positive for congestion, rhinorrhea and sore throat.   Respiratory: Positive for cough. Negative for shortness of breath.   Cardiovascular: Negative for chest pain.  Gastrointestinal: Positive for nausea. Negative for abdominal pain and vomiting.  Neurological: Positive for light-headedness.  All other systems reviewed and are negative.    Physical Exam Updated Vital Signs BP 136/80 (BP Location: Right Arm)   Pulse (!) 56   Temp 98.4 F (36.9 C) (Oral)   Resp 18   Ht 5\' 9"  (1.753 m)   Wt 63.5 kg   SpO2 100%   BMI 20.67 kg/m   Physical Exam  Constitutional: He appears well-developed and well-nourished.  HENT:  Head: Normocephalic and atraumatic.  Nose: Mucosal edema present.  Edematous and erythematous nasal turbinates bilaterally.  Airways patent, phonation is intact.  Uvula is midline.  No trismus.  Posterior pharynx slightly erythematous with no evidence of exudate or edema.  Eyes: Conjunctivae and EOM are normal. Right eye exhibits no discharge. Left eye exhibits no discharge. No scleral icterus.  Pulmonary/Chest: Effort normal and breath  sounds normal.  Lungs clear to auscultation bilaterally.  Symmetric chest rise.  No wheezing, rales, rhonchi.  Neurological: He is alert.  Skin: Skin is warm and dry.  Psychiatric: He has a normal mood and affect. His speech is normal and behavior is normal.  Nursing note and vitals reviewed.    ED Treatments / Results  Labs (all labs ordered are listed, but only abnormal results are displayed) Labs Reviewed  GROUP A STREP BY PCR    EKG None  Radiology Dg Chest 2 View  Result Date: 08/03/2018 CLINICAL DATA:  Flu-like symptoms. EXAM: CHEST - 2  VIEW COMPARISON:  No prior. FINDINGS: Mediastinum and hilar structures are normal. Mild left suprahilar infiltrate cannot be excluded. No pleural effusion or pneumothorax. Heart size normal. IMPRESSION: Mild left suprahilar infiltrate cannot be excluded. Electronically Signed   By: Maisie Fus  Register   On: 08/03/2018 17:01    Procedures Procedures (including critical care time)  Medications Ordered in ED Medications - No data to display   Initial Impression / Assessment and Plan / ED Course  I have reviewed the triage vital signs and the nursing notes.  Pertinent labs & imaging results that were available during my care of the patient were reviewed by me and considered in my medical decision making (see chart for details).     32 year old male who presents for evaluation of 1 week of upper respiratory symptoms, cough.  He reports today he, felt more nauseous and lightheaded.  No abdominal pain, vomiting. Patient is afebrile, non-toxic appearing, sitting comfortably on examination table. Vital signs reviewed and stable.  Consider URI versus pharyngitis versus infectious etiology.  We will plan for strep and chest x-ray.  History/physical exam not concerning for Ludwig angina or peritonsillar abscess.  Strep reviewed and negative.   Chest x-ray shows questionable left suprahilar infiltrate.  No other abnormalities.  Given the symptoms been ongoing for a week we will plan to send home with a course of azithromycin for treatment.  Discussed results with patient.  Encourage at home supportive care measures. Patient had ample opportunity for questions and discussion. All patient's questions were answered with full understanding. Strict return precautions discussed. Patient expresses understanding and agreement to plan.    Final Clinical Impressions(s) / ED Diagnoses   Final diagnoses:  Upper respiratory tract infection, unspecified type    ED Discharge Orders         Ordered    azithromycin  (ZITHROMAX) 250 MG tablet  Daily     08/03/18 1748           Maxwell Caul, PA-C 08/03/18 1909    Raeford Razor, MD 08/07/18 [phone]

## 2018-12-30 ENCOUNTER — Emergency Department (HOSPITAL_BASED_OUTPATIENT_CLINIC_OR_DEPARTMENT_OTHER)
Admission: EM | Admit: 2018-12-30 | Discharge: 2018-12-30 | Disposition: A | Discharge status: Home / Self Care | Payer: Self-pay | Attending: Emergency Medicine | Admitting: Emergency Medicine

## 2018-12-30 ENCOUNTER — Other Ambulatory Visit: Payer: Self-pay

## 2018-12-30 ENCOUNTER — Encounter (HOSPITAL_BASED_OUTPATIENT_CLINIC_OR_DEPARTMENT_OTHER): Payer: Self-pay | Admitting: Emergency Medicine

## 2018-12-30 ENCOUNTER — Emergency Department (HOSPITAL_BASED_OUTPATIENT_CLINIC_OR_DEPARTMENT_OTHER): Payer: Self-pay

## 2018-12-30 DIAGNOSIS — J111 Influenza due to unidentified influenza virus with other respiratory manifestations: Secondary | ICD-10-CM | POA: Insufficient documentation

## 2018-12-30 DIAGNOSIS — R69 Illness, unspecified: Secondary | ICD-10-CM

## 2018-12-30 DIAGNOSIS — F17210 Nicotine dependence, cigarettes, uncomplicated: Secondary | ICD-10-CM | POA: Insufficient documentation

## 2018-12-30 LAB — GROUP A STREP BY PCR: Group A Strep by PCR: NOT DETECTED

## 2018-12-30 NOTE — ED Notes (Signed)
Pt c/o cough and sore throat x 1 week. Pt states wife and child diagnosed with flu. Pt denies fever, c/o diarrhea, no emesis. Pt is c/o nausea. Pt also c/o headache and generalized body aches.

## 2018-12-30 NOTE — ED Provider Notes (Signed)
[facility] EMERGENCY DEPARTMENT Provider Note   CSN: [id number] Arrival date & time: 12/30/18  1236    History   Chief Complaint Chief Complaint  Patient presents with  . Sore Throat    HPI Kevin Stout is a 33 y.o. male present day for flulike illness.  Patient reports that 2 weeks ago his daughter was diagnosed with the flu.  Patient reports that 7 days ago his son was diagnosed with the flu.  Patient reports that approximately 5 days ago he began feeling sick as well he describes fever, rhinorrhea, congestion, mild cough with yellow/white sputum, generalized body aches and occasional diarrhea.  Patient states that the symptoms return apex 3 days ago and have been gradually improving since that time.  Patient denies focal area of pain he describes generalized body aches, mild aching pain to his joints and muscles constant worsened with movement and improved with Tylenol.  Patient has been treating his cough with Mucinex DM with significant relief.  He denies chest pain, shortness of breath or hemoptysis.  Patient reports feeling warm however has not measured a fever at home.  Patient reports intermittent nonbloody diarrhea, he states that this happens occasionally he will have a loose stool every other day or so with normal bowel movements in between.  Denies abdominal pain, nausea or vomiting.  Patient reports that he is otherwise healthy 33 year old male without history of immunocompromising diseases or daily medication use.    HPI  History reviewed. No pertinent past medical history.  There are no active problems to display for this patient.   Past Surgical History:  Procedure Laterality Date  . HERNIA REPAIR    . ORIF TIBIA FRACTURE          Home Medications    Prior to Admission medications   Medication Sig Start Date End Date Taking? Authorizing Provider  azithromycin (ZITHROMAX) 250 MG tablet Take 1 tablet (250 mg total) by mouth daily. Take  first 2 tablets together, then 1 every day until finished. 08/03/18   Maxwell Caul, PA-C  HYDROcodone-acetaminophen (NORCO) 5-325 MG tablet Take 1 tablet by mouth every 6 (six) hours as needed for severe pain. 09/03/17   Molpus, John, MD  naproxen (NAPROSYN) 375 MG tablet Take 1 tablet twice daily as needed for pain. 09/03/17   Molpus, John, MD    Family History No family history on file.  Social History Social History   Tobacco Use  . Smoking status: Current Every Day Smoker    Packs/day: 5.00    Types: Cigarettes  . Smokeless tobacco: Never Used  Substance Use Topics  . Alcohol use: Yes    Comment: occ  . Drug use: No     Allergies   Patient has no known allergies.   Review of Systems Review of Systems  Constitutional: Positive for fever (Subjective). Negative for appetite change and chills.  HENT: Positive for congestion, rhinorrhea and sore throat. Negative for drooling, trouble swallowing and voice change.   Respiratory: Positive for cough. Negative for shortness of breath.   Cardiovascular: Negative.  Negative for chest pain.  Gastrointestinal: Positive for diarrhea. Negative for abdominal pain, blood in stool, nausea and vomiting.  Musculoskeletal: Positive for arthralgias and myalgias. Negative for neck pain and neck stiffness.  Neurological: Negative.  Negative for dizziness, weakness, numbness and headaches.   Physical Exam Updated Vital Signs BP 129/74 (BP Location: Right Arm)   Pulse (!) 50   Temp 98.4 F (36.9 C) (Oral)  Resp 18   Ht 5\' 9"  (1.753 m)   Wt 68 kg   SpO2 97%   BMI 22.15 kg/m   Physical Exam Constitutional:      General: He is not in acute distress.    Appearance: Normal appearance. He is well-developed. He is not ill-appearing or diaphoretic.  HENT:     Head: Normocephalic and atraumatic.     Jaw: There is normal jaw occlusion. No trismus.     Right Ear: Tympanic membrane, ear canal and external ear normal.     Left Ear: Tympanic  membrane, ear canal and external ear normal.     Nose: Rhinorrhea present. Rhinorrhea is clear.     Right Sinus: No maxillary sinus tenderness or frontal sinus tenderness.     Left Sinus: No maxillary sinus tenderness or frontal sinus tenderness.     Mouth/Throat:     Lips: Pink.     Pharynx: Oropharynx is clear. Uvula midline.     Comments: The patient has normal phonation and is in control of secretions. No stridor.  Midline uvula without edema. Soft palate rises symmetrically. No tonsillar erythema, swelling or exudates. Tongue protrusion is normal, floor of mouth is soft. No trismus. No creptius on neck palpation. No gingival erythema or fluctuance noted. Mucus membranes moist. Eyes:     General: Vision grossly intact. Gaze aligned appropriately.     Extraocular Movements: Extraocular movements intact.     Conjunctiva/sclera: Conjunctivae normal.     Pupils: Pupils are equal, round, and reactive to light.  Neck:     Musculoskeletal: Full passive range of motion without pain, normal range of motion and neck supple.     Trachea: Trachea and phonation normal. No tracheal deviation.     Meningeal: Brudzinski's sign absent.  Cardiovascular:     Rate and Rhythm: Normal rate and regular rhythm.     Pulses:          Dorsalis pedis pulses are 2+ on the right side and 2+ on the left side.       Posterior tibial pulses are 2+ on the right side and 2+ on the left side.     Heart sounds: Normal heart sounds.  Pulmonary:     Effort: Pulmonary effort is normal. No respiratory distress.     Breath sounds: Normal breath sounds and air entry. No wheezing or rhonchi.  Chest:     Chest wall: No tenderness.  Abdominal:     General: Bowel sounds are normal. There is no distension.     Palpations: Abdomen is soft.     Tenderness: There is no abdominal tenderness. There is no guarding or rebound. Negative signs include Murphy's sign, Rovsing's sign and McBurney's sign.  Musculoskeletal: Normal range of  motion.  Feet:     Right foot:     Protective Sensation: 3 sites tested. 3 sites sensed.     Left foot:     Protective Sensation: 3 sites tested. 3 sites sensed.  Skin:    General: Skin is warm and dry.  Neurological:     Mental Status: He is alert.     GCS: GCS eye subscore is 4. GCS verbal subscore is 5. GCS motor subscore is 6.     Comments: Speech is clear and goal oriented, follows commands Major Cranial nerves without deficit, no facial droop Moves extremities without ataxia, coordination intact Normal Gait  Psychiatric:        Behavior: Behavior normal.    ED Treatments /  Results  Labs (all labs ordered are listed, but only abnormal results are displayed) Labs Reviewed  GROUP A STREP BY PCR    EKG None  Radiology Dg Chest 2 View  Result Date: 12/30/2018 CLINICAL DATA:  Cough and fever for 5 days. EXAM: CHEST - 2 VIEW COMPARISON:  08/03/2018 FINDINGS: The cardiomediastinal silhouette is unremarkable. There is no evidence of focal airspace disease, pulmonary edema, suspicious pulmonary nodule/mass, pleural effusion, or pneumothorax. No acute bony abnormalities are identified. IMPRESSION: No active cardiopulmonary disease. Electronically Signed   By: Harmon Pier M.D.   On: 12/30/2018 14:28    Procedures Procedures (including critical care time)  Medications Ordered in ED Medications - No data to display   Initial Impression / Assessment and Plan / ED Course  I have reviewed the triage vital signs and the nursing notes.  Pertinent labs & imaging results that were available during my care of the patient were reviewed by me and considered in my medical decision making (see chart for details).    33 year old male presenting today for 5-day history of flulike illness.  Patient's children recently tested positive for flu.  Patient with rhinorrhea, congestion, minimally productive cough, body aches and occasional diarrhea.  Patient reports that his symptoms have been  resolving over the past 2 days.  Patient denies nausea/vomiting, abdominal pain, chest pain/shortness of breath, headache, neck pain/stiffness or any additional concerns.  On arrival patient is overall very well-appearing, afebrile, not tachycardic, not hypotensive and with SPO2 of 99% on room air.  Physical examination reveals rhinorrhea.  Airway is clear, no signs of Ludwig's angina, peritonsillar abscess, retropharyngeal abscess or other deep tissue infections of the head or neck.  Lungs clear to auscultation bilaterally.  Abdomen soft, nondistended and nontender, no peritoneal signs.  Strep test negative Chest x-ray:  IMPRESSION:  No active cardiopulmonary disease.  ---------------------- Patient updated on results today and he is now requesting discharge.  Appears that his main concern today was possibility of pneumonia despite his resolving symptoms. --- Patient with symptoms consistent with influenza.  Patient's contact history also support this diagnosis.  Vitals are stable.  No signs of dehydration, tolerating PO's.  Lungs are clear. The patient understands that symptoms are greater than the recommended 24-48 hour window of treatment.  Patient will be discharged with instructions to orally hydrate, rest, and use over-the-counter medications such as anti-inflammatories ibuprofen and Aleve for muscle aches and Tylenol for fever.  Patient reports he has been taking Mucinex DM OTC for his cough with great relief and does not want additional cough medicine today.  Additionally patient with intermittent diarrhea which he reports as resolving with occasional normal bowel movement.  Patient is without abdominal pain.  Do not suspect acute intra-abdominal infection requiring antibiotics as cause of diarrhea, suspect this related to patient's viral illness.  No indication for imaging at this time.  On reevaluation of the abdomen it is soft, nontender and there are no peritoneal signs.  Patient denies  abdominal pain again on reevaluation.  He has been eating and drinking without difficulty and appears stable for discharge.  At this time there does not appear to be any evidence of an acute emergency medical condition and the patient appears stable for discharge with appropriate outpatient follow up. Diagnosis was discussed with patient who verbalizes understanding of care plan and is agreeable to discharge. I have discussed return precautions with patient who verbalizes understanding of return precautions. Patient strongly encouraged to follow-up with their PCP. All  questions answered.  Patient has been discharged in good condition.  Note: Portions of this report may have been transcribed using voice recognition software. Every effort was made to ensure accuracy; however, inadvertent computerized transcription errors may still be present. Final Clinical Impressions(s) / ED Diagnoses   Final diagnoses:  Influenza-like illness    ED Discharge Orders    None       Elizabeth Palau 12/30/18 1625    Linwood Dibbles, MD 12/30/18 2214

## 2018-12-30 NOTE — ED Notes (Signed)
Patient back in room from X-ray 

## 2018-12-30 NOTE — ED Notes (Signed)
Patient transported to X-ray 

## 2018-12-30 NOTE — ED Notes (Signed)
ED Provider at bedside. 

## 2018-12-30 NOTE — ED Triage Notes (Signed)
Sore throat, cough for 1 week. Daughter recently tx for flu.

## 2018-12-30 NOTE — Discharge Instructions (Addendum)
You have been diagnosed today with influenza-like illness.  At this time there does not appear to be the presence of an emergent medical condition, however there is always the potential for conditions to change. Please read and follow the below instructions.  Please return to the Emergency Department immediately for any new or worsening symptoms or if your symptoms do not improve within 3 days. Please be sure to follow up with your Primary Care Provider within one week regarding your visit today; please call their office to schedule an appointment even if you are feeling better for a follow-up visit. Please drink plenty of water and get plenty of rest.  Increasing water intake is important to avoid dehydration. You may continue to use the over-the-counter medication Mucinex as directed on the packaging to help with your cough.  Additionally you may use over-the-counter ibuprofen as directed on the packaging to help with your body aches.  Get help right away if you: Have shortness of breath. Have trouble breathing. Have skin or nails that turn a bluish color. Have very bad pain or stiffness in your neck. Get a sudden headache. Get sudden pain in your face or ear. Cannot eat or drink without throwing up. Any new or concerning symptoms.  Please read the additional information packets attached to your discharge summary.  Do not take your medicine if  develop an itchy rash, swelling in your mouth or lips, or difficulty breathing.

## 2019-10-19 IMAGING — CR DG CHEST 2V
2 series · 2 of 2 positions shown · non-contrast
Comparison: No prior.

CLINICAL DATA: Flu-like symptoms.

EXAM:
CHEST - 2 VIEW

[w chest pa]
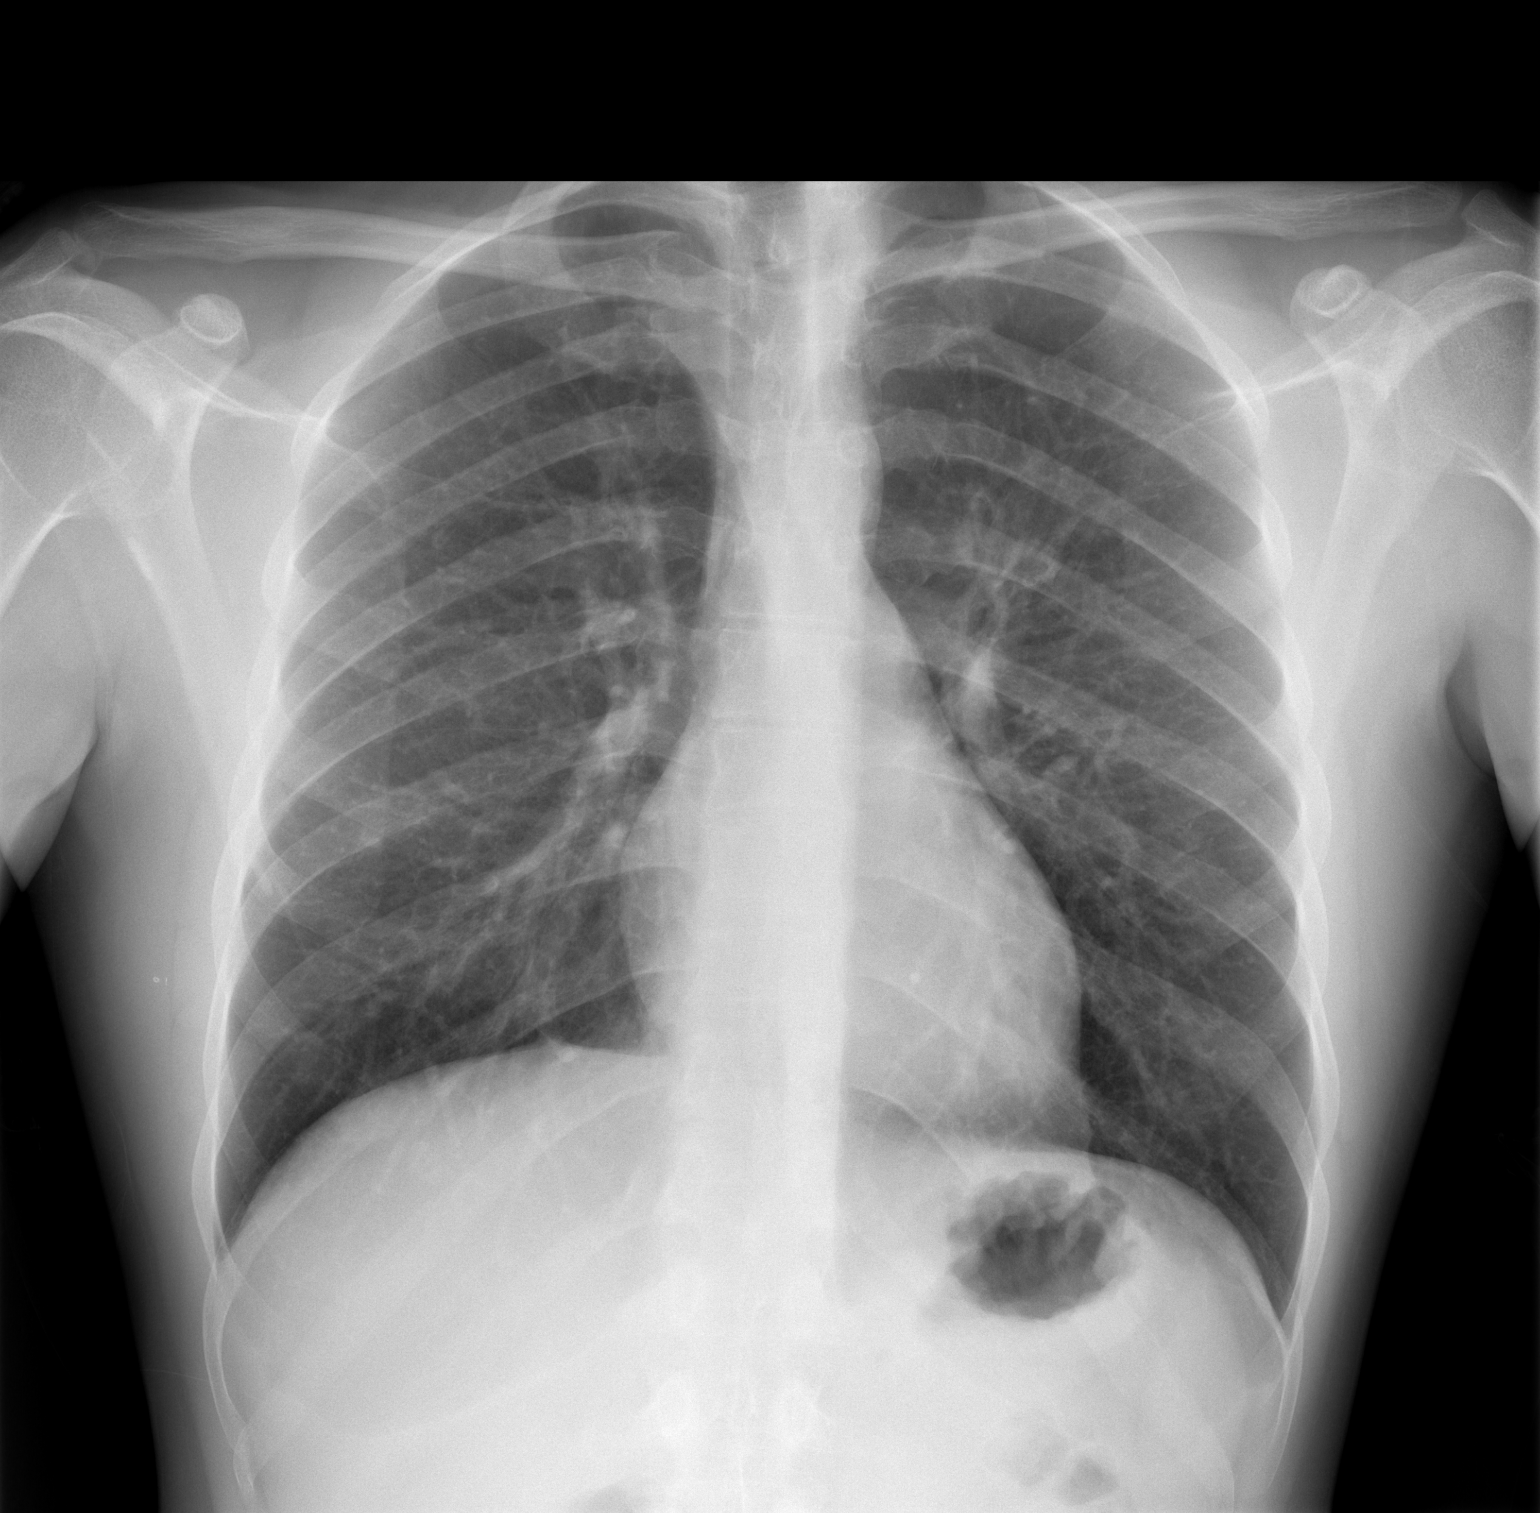

[w chest lat]
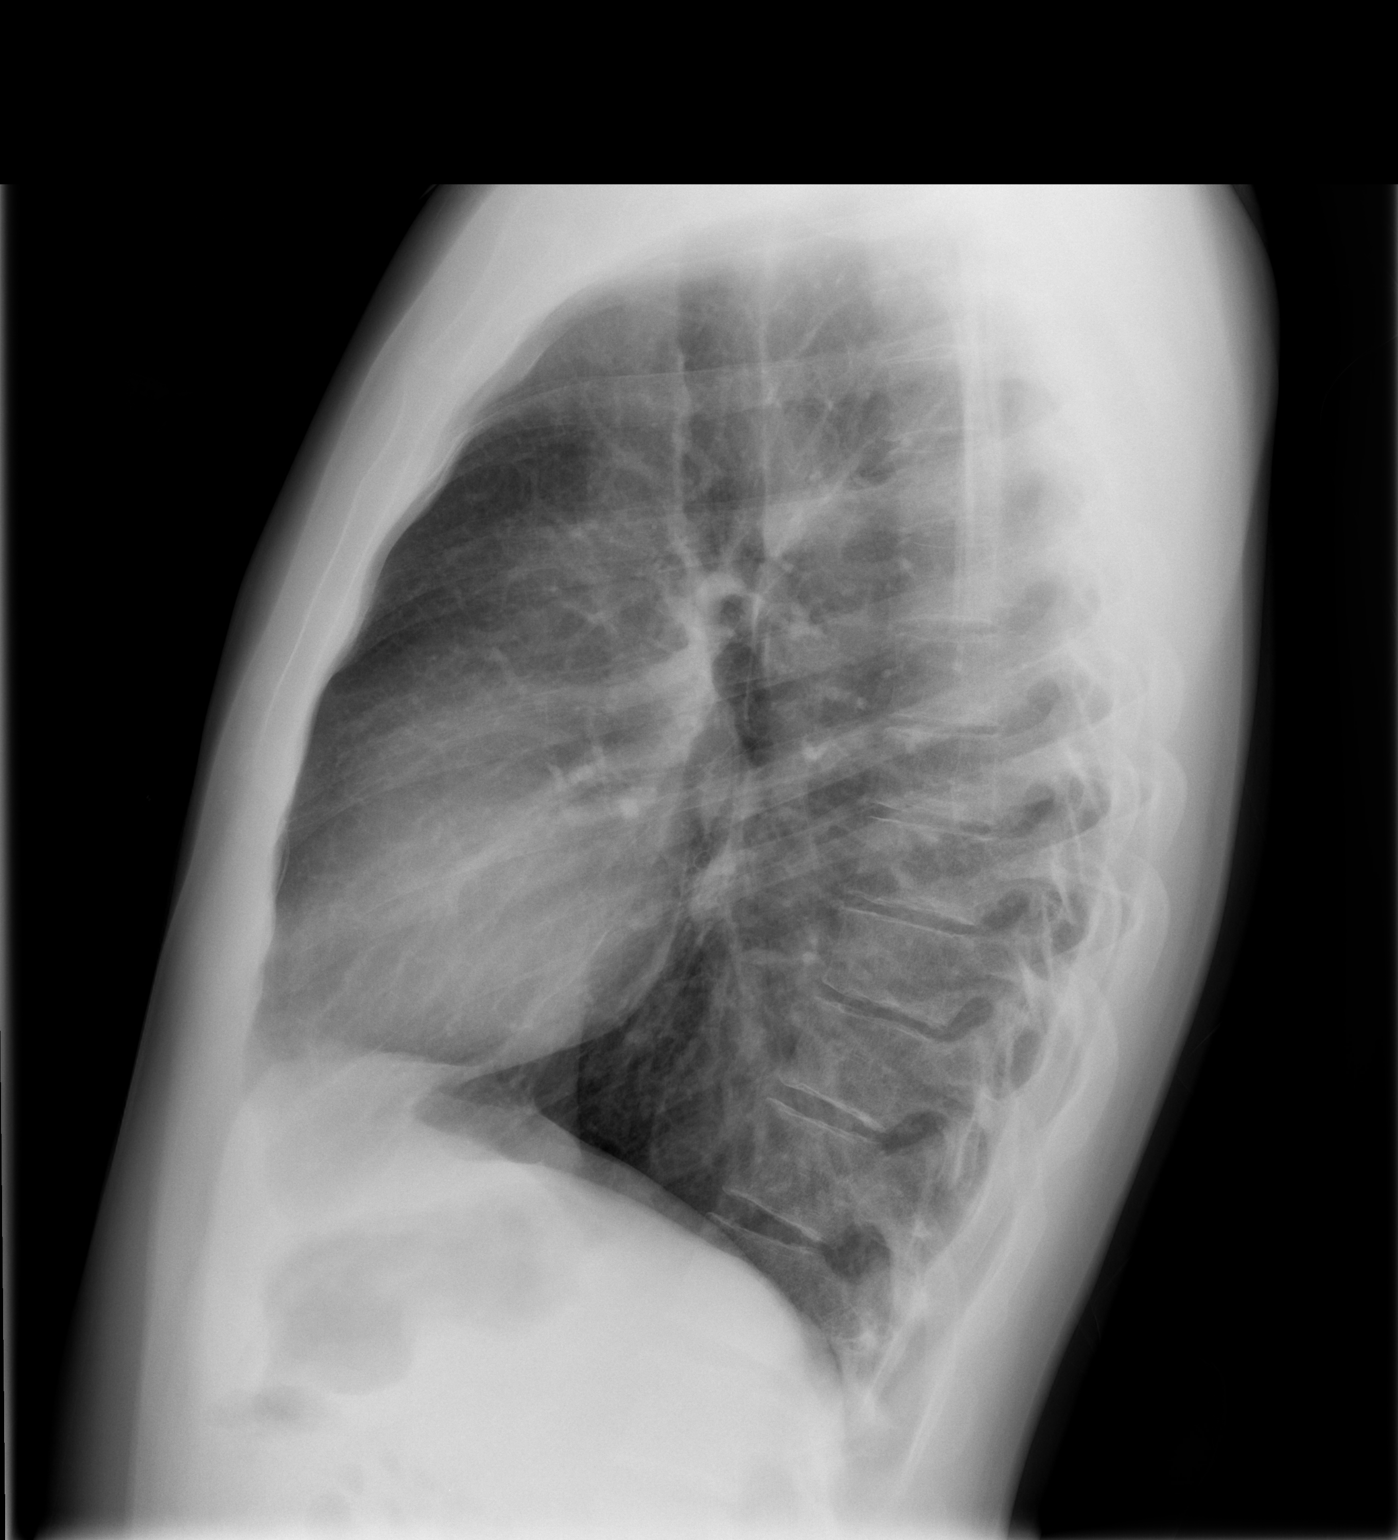

[2 of 2 positions shown; findings below may reference images not displayed]

FINDINGS: Mediastinum and hilar structures are normal. Mild left suprahilar
infiltrate cannot be excluded. No pleural effusion or pneumothorax.
Heart size normal.
IMPRESSION: Mild left suprahilar infiltrate cannot be excluded.

## 2020-03-16 IMAGING — DX DG CHEST 2V
2 series · 2 of 2 positions shown · non-contrast
Comparison: 08/03/2018

CLINICAL DATA: Cough and fever for 5 days.

EXAM:
CHEST - 2 VIEW

[chest pa]
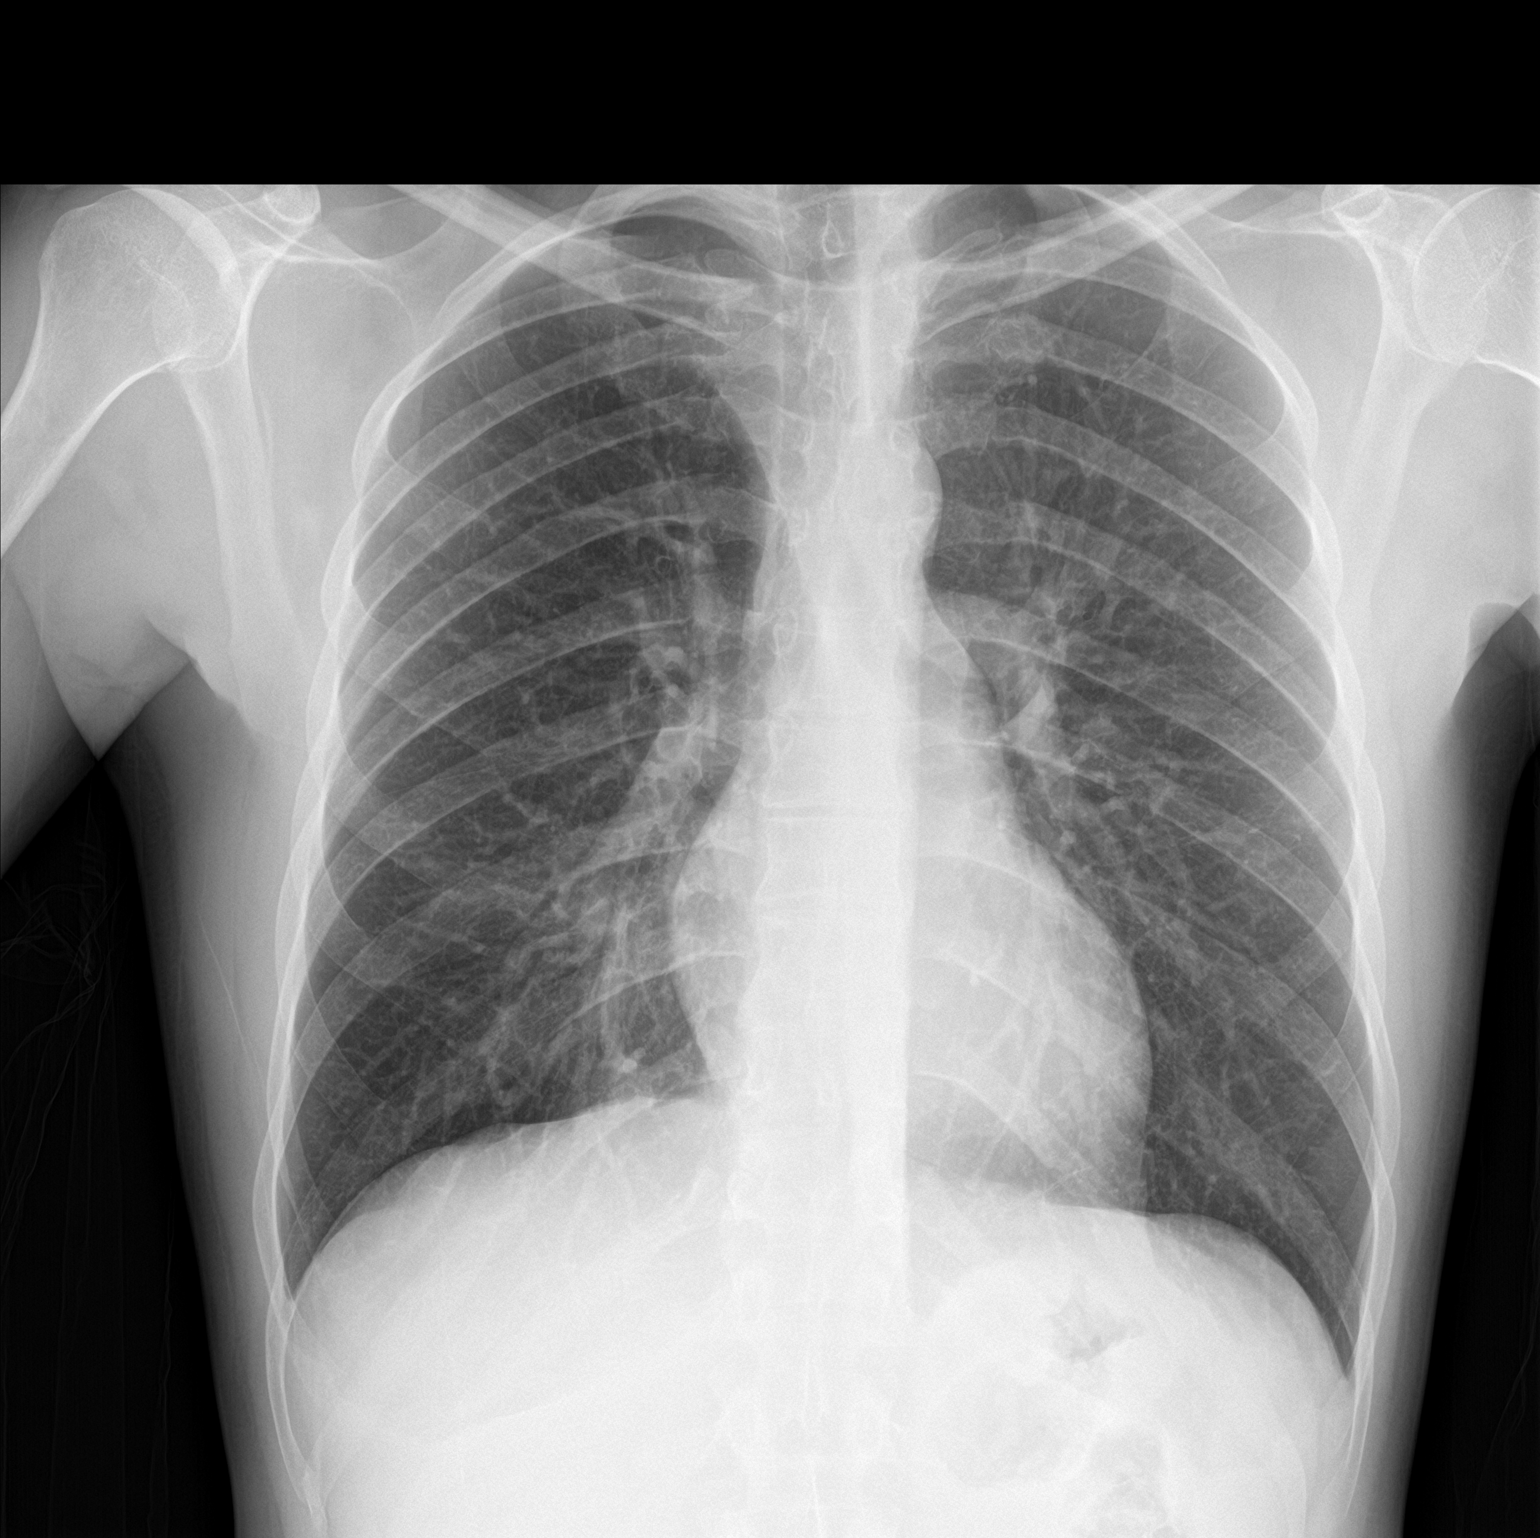

[chest lat]
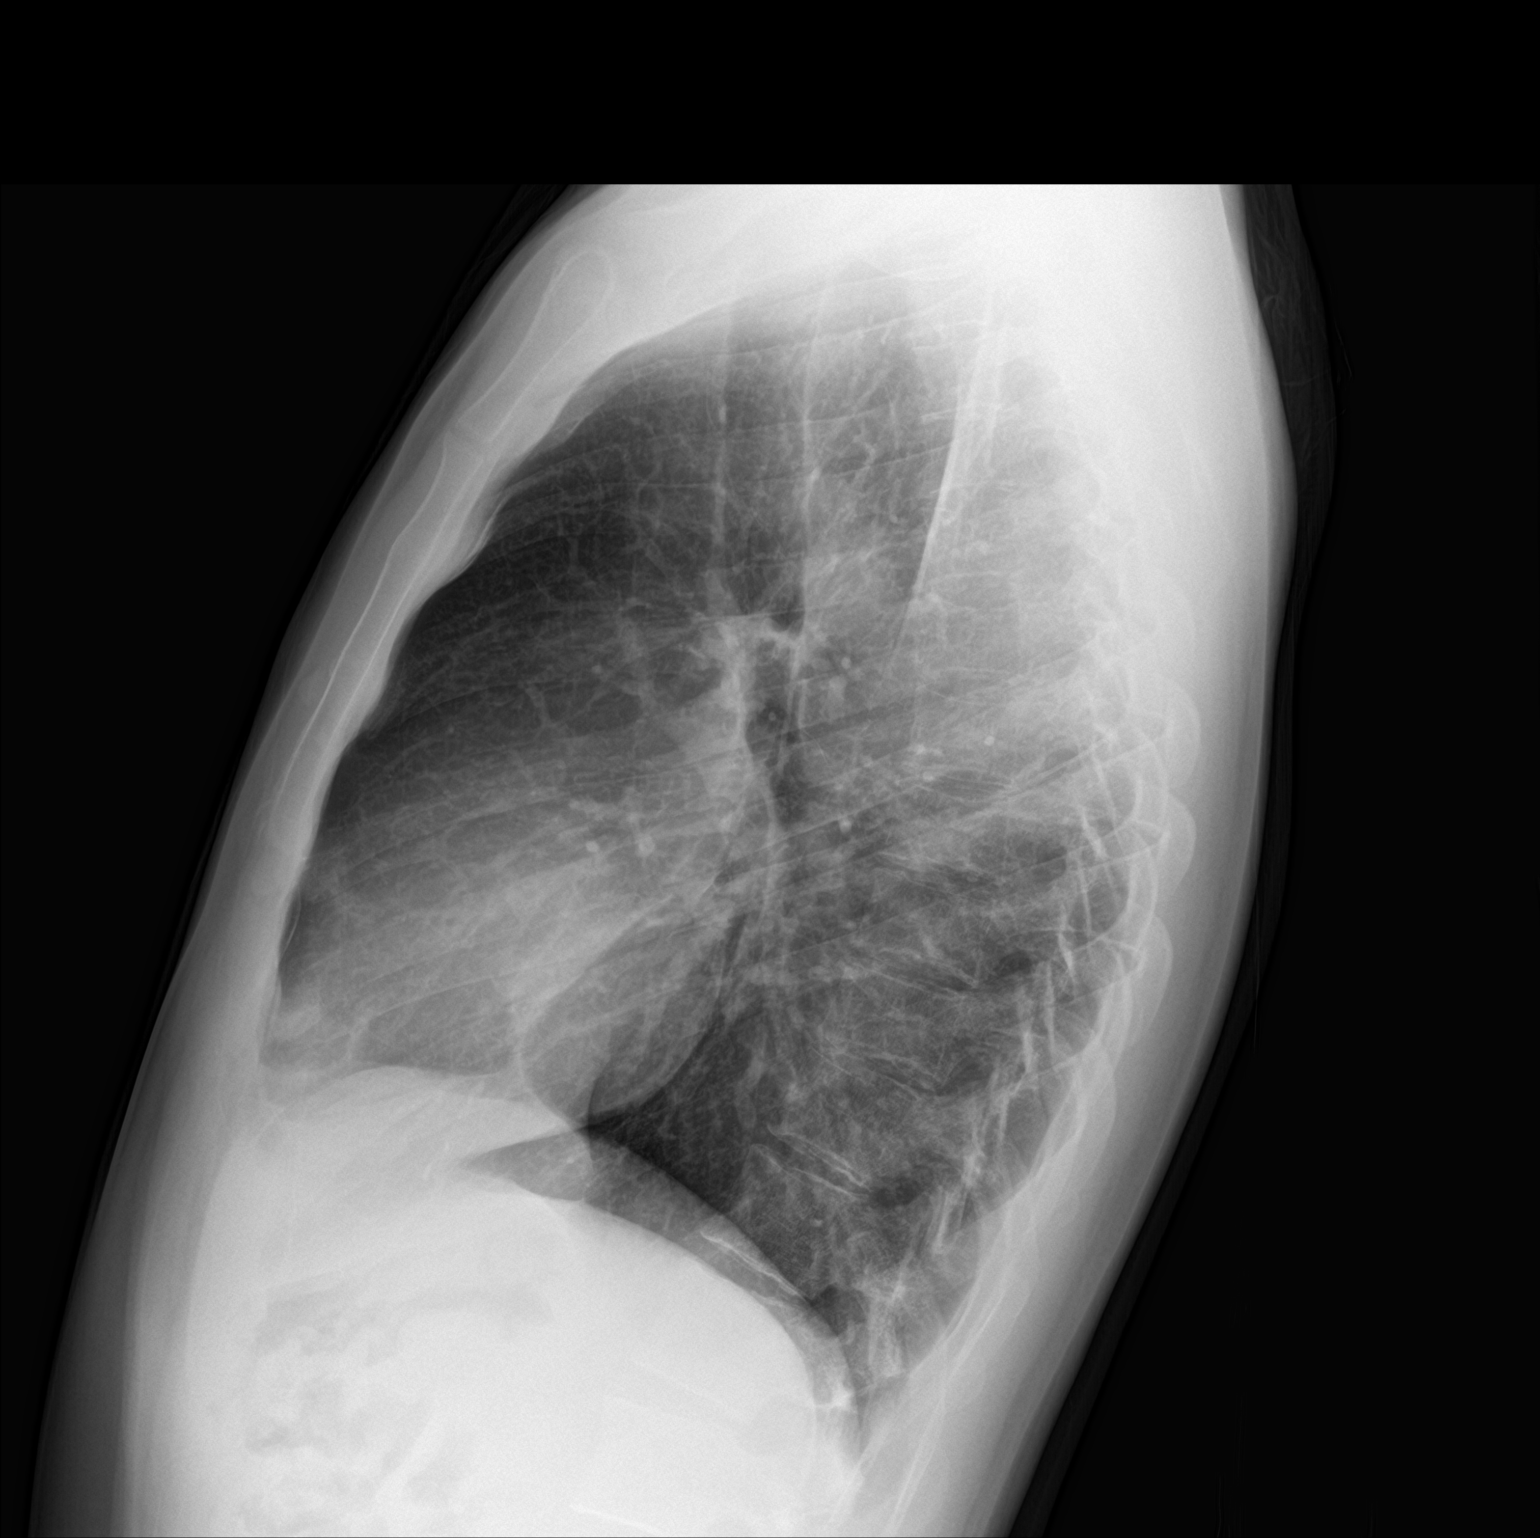

[2 of 2 positions shown; findings below may reference images not displayed]

FINDINGS: The cardiomediastinal silhouette is unremarkable.

There is no evidence of focal airspace disease, pulmonary edema,
suspicious pulmonary nodule/mass, pleural effusion, or pneumothorax.

No acute bony abnormalities are identified.
IMPRESSION: No active cardiopulmonary disease.

## 2021-02-10 ENCOUNTER — Emergency Department (HOSPITAL_COMMUNITY): Payer: Self-pay

## 2021-02-10 ENCOUNTER — Encounter (HOSPITAL_COMMUNITY): Payer: Self-pay | Admitting: Emergency Medicine

## 2021-02-10 ENCOUNTER — Other Ambulatory Visit: Payer: Self-pay

## 2021-02-10 ENCOUNTER — Inpatient Hospital Stay (HOSPITAL_COMMUNITY)
Admission: EM | Admit: 2021-02-10 | Discharge: 2021-02-13 | DRG: 917 | Disposition: A | Discharge status: Other Acute Inpatient Hospital | Payer: Self-pay | Attending: Student | Admitting: Student

## 2021-02-10 DIAGNOSIS — J9600 Acute respiratory failure, unspecified whether with hypoxia or hypercapnia: Secondary | ICD-10-CM | POA: Diagnosis present

## 2021-02-10 DIAGNOSIS — Y92512 Supermarket, store or market as the place of occurrence of the external cause: Secondary | ICD-10-CM

## 2021-02-10 DIAGNOSIS — T450X2A Poisoning by antiallergic and antiemetic drugs, intentional self-harm, initial encounter: Principal | ICD-10-CM | POA: Diagnosis present

## 2021-02-10 DIAGNOSIS — E162 Hypoglycemia, unspecified: Secondary | ICD-10-CM | POA: Diagnosis present

## 2021-02-10 DIAGNOSIS — G4700 Insomnia, unspecified: Secondary | ICD-10-CM | POA: Diagnosis present

## 2021-02-10 DIAGNOSIS — Z681 Body mass index (BMI) 19 or less, adult: Secondary | ICD-10-CM

## 2021-02-10 DIAGNOSIS — R630 Anorexia: Secondary | ICD-10-CM | POA: Diagnosis present

## 2021-02-10 DIAGNOSIS — F17210 Nicotine dependence, cigarettes, uncomplicated: Secondary | ICD-10-CM | POA: Diagnosis present

## 2021-02-10 DIAGNOSIS — E872 Acidosis: Secondary | ICD-10-CM | POA: Diagnosis present

## 2021-02-10 DIAGNOSIS — F1290 Cannabis use, unspecified, uncomplicated: Secondary | ICD-10-CM | POA: Diagnosis present

## 2021-02-10 DIAGNOSIS — F1510 Other stimulant abuse, uncomplicated: Secondary | ICD-10-CM | POA: Diagnosis present

## 2021-02-10 DIAGNOSIS — Z20822 Contact with and (suspected) exposure to covid-19: Secondary | ICD-10-CM | POA: Diagnosis present

## 2021-02-10 DIAGNOSIS — F17200 Nicotine dependence, unspecified, uncomplicated: Secondary | ICD-10-CM | POA: Diagnosis present

## 2021-02-10 DIAGNOSIS — T50904A Poisoning by unspecified drugs, medicaments and biological substances, undetermined, initial encounter: Secondary | ICD-10-CM

## 2021-02-10 DIAGNOSIS — G928 Other toxic encephalopathy: Secondary | ICD-10-CM | POA: Diagnosis present

## 2021-02-10 DIAGNOSIS — T50901A Poisoning by unspecified drugs, medicaments and biological substances, accidental (unintentional), initial encounter: Secondary | ICD-10-CM | POA: Diagnosis present

## 2021-02-10 DIAGNOSIS — R569 Unspecified convulsions: Secondary | ICD-10-CM | POA: Diagnosis present

## 2021-02-10 DIAGNOSIS — F329 Major depressive disorder, single episode, unspecified: Secondary | ICD-10-CM | POA: Diagnosis present

## 2021-02-10 DIAGNOSIS — Z9151 Personal history of suicidal behavior: Secondary | ICD-10-CM

## 2021-02-10 DIAGNOSIS — E559 Vitamin D deficiency, unspecified: Secondary | ICD-10-CM | POA: Diagnosis present

## 2021-02-10 DIAGNOSIS — E876 Hypokalemia: Secondary | ICD-10-CM | POA: Diagnosis present

## 2021-02-10 DIAGNOSIS — R9431 Abnormal electrocardiogram [ECG] [EKG]: Secondary | ICD-10-CM | POA: Diagnosis present

## 2021-02-10 DIAGNOSIS — Z79899 Other long term (current) drug therapy: Secondary | ICD-10-CM

## 2021-02-10 DIAGNOSIS — T1491XA Suicide attempt, initial encounter: Secondary | ICD-10-CM | POA: Diagnosis present

## 2021-02-10 DIAGNOSIS — Z0189 Encounter for other specified special examinations: Secondary | ICD-10-CM

## 2021-02-10 DIAGNOSIS — E871 Hypo-osmolality and hyponatremia: Secondary | ICD-10-CM | POA: Diagnosis present

## 2021-02-10 DIAGNOSIS — Z885 Allergy status to narcotic agent status: Secondary | ICD-10-CM

## 2021-02-10 DIAGNOSIS — Z818 Family history of other mental and behavioral disorders: Secondary | ICD-10-CM

## 2021-02-10 DIAGNOSIS — J029 Acute pharyngitis, unspecified: Secondary | ICD-10-CM | POA: Diagnosis not present

## 2021-02-10 LAB — URINALYSIS, ROUTINE W REFLEX MICROSCOPIC
Bacteria, UA: NONE SEEN
Bilirubin Urine: NEGATIVE
Glucose, UA: NEGATIVE mg/dL
Hgb urine dipstick: NEGATIVE
Ketones, ur: NEGATIVE mg/dL
Leukocytes,Ua: NEGATIVE
Nitrite: NEGATIVE
Protein, ur: 30 mg/dL — AB
Specific Gravity, Urine: 1.028 (ref 1.005–1.030)
pH: 5 (ref 5.0–8.0)

## 2021-02-10 LAB — COMPREHENSIVE METABOLIC PANEL
ALT: 9 U/L (ref 0–44)
AST: 16 U/L (ref 15–41)
Albumin: 3.9 g/dL (ref 3.5–5.0)
Alkaline Phosphatase: 50 U/L (ref 38–126)
Anion gap: 10 (ref 5–15)
BUN: 19 mg/dL (ref 6–20)
CO2: 20 mmol/L — ABNORMAL LOW (ref 22–32)
Calcium: 8.1 mg/dL — ABNORMAL LOW (ref 8.9–10.3)
Chloride: 108 mmol/L (ref 98–111)
Creatinine, Ser: 0.92 mg/dL (ref 0.61–1.24)
GFR, Estimated: >60 mL/min (ref >60)
Glucose, Bld: 62 mg/dL — ABNORMAL LOW (ref 70–99)
Potassium: 3.8 mmol/L (ref 3.5–5.1)
Sodium: 138 mmol/L (ref 135–145)
Total Bilirubin: 0.4 mg/dL (ref 0.3–1.2)
Total Protein: 6.4 g/dL — ABNORMAL LOW (ref 6.5–8.1)

## 2021-02-10 LAB — BLOOD GAS, ARTERIAL
Acid-base deficit: 13.7 mmol/L — ABNORMAL HIGH (ref 0.0–2.0)
Bicarbonate: 11.3 mmol/L — ABNORMAL LOW (ref 20.0–28.0)
Drawn by: 25770
FIO2: 100
MECHVT: 560 mL
O2 Saturation: 85 %
PEEP: 5 cmH2O
Patient temperature: 98.6
RATE: 20 resp/min
pCO2 arterial: 23.2 mmHg — ABNORMAL LOW (ref 32.0–48.0)
pH, Arterial: 7.308 — ABNORMAL LOW (ref 7.350–7.450)
pO2, Arterial: 55.3 mmHg — ABNORMAL LOW (ref 83.0–108.0)

## 2021-02-10 LAB — LACTIC ACID, PLASMA
Lactic Acid, Venous: 3 mmol/L (ref 0.5–1.9)
Lactic Acid, Venous: 1.1 mmol/L (ref 0.5–1.9)

## 2021-02-10 LAB — CBC WITH DIFFERENTIAL/PLATELET
Abs Immature Granulocytes: 0.03 10*3/uL (ref 0.00–0.07)
Basophils Absolute: 0 10*3/uL (ref 0.0–0.1)
Basophils Relative: 0 %
Eosinophils Absolute: 0.1 10*3/uL (ref 0.0–0.5)
Eosinophils Relative: 1 %
HCT: 40.6 % (ref 39.0–52.0)
Hemoglobin: 14.1 g/dL (ref 13.0–17.0)
Immature Granulocytes: 0 %
Lymphocytes Relative: 20 %
Lymphs Abs: 1.5 10*3/uL (ref 0.7–4.0)
MCH: 31.2 pg (ref 26.0–34.0)
MCHC: 34.7 g/dL (ref 30.0–36.0)
MCV: 89.8 fL (ref 80.0–100.0)
Monocytes Absolute: 0.8 10*3/uL (ref 0.1–1.0)
Monocytes Relative: 10 %
Neutro Abs: 5 10*3/uL (ref 1.7–7.7)
Neutrophils Relative %: 69 %
Platelets: 238 10*3/uL (ref 150–400)
RBC: 4.52 MIL/uL (ref 4.22–5.81)
RDW: 12.5 % (ref 11.5–15.5)
WBC: 7.4 10*3/uL (ref 4.0–10.5)
nRBC: 0 % (ref 0.0–0.2)

## 2021-02-10 LAB — TROPONIN I (HIGH SENSITIVITY)
Troponin I (High Sensitivity): <2 ng/L
Troponin I (High Sensitivity): 2 ng/L (ref <18)

## 2021-02-10 LAB — CBG MONITORING, ED
Glucose-Capillary: 61 mg/dL — ABNORMAL LOW (ref 70–99)
Glucose-Capillary: 127 mg/dL — ABNORMAL HIGH (ref 70–99)
Glucose-Capillary: 50 mg/dL — ABNORMAL LOW (ref 70–99)
Glucose-Capillary: 53 mg/dL — ABNORMAL LOW (ref 70–99)

## 2021-02-10 LAB — ACETAMINOPHEN LEVEL: Acetaminophen (Tylenol), Serum: <10 ug/mL — ABNORMAL LOW (ref 10–30)

## 2021-02-10 LAB — RAPID URINE DRUG SCREEN, HOSP PERFORMED
Amphetamines: POSITIVE — AB
Barbiturates: NOT DETECTED
Benzodiazepines: POSITIVE — AB
Cocaine: NOT DETECTED
Opiates: NOT DETECTED
Tetrahydrocannabinol: POSITIVE — AB

## 2021-02-10 LAB — SALICYLATE LEVEL: Salicylate Lvl: <7 mg/dL — ABNORMAL LOW (ref 7.0–30.0)

## 2021-02-10 LAB — ETHANOL: Alcohol, Ethyl (B): <10 mg/dL (ref <10)

## 2021-02-10 MED ORDER — SODIUM CHLORIDE 0.9 % IV BOLUS
30.0000 mL/kg | Freq: Once | INTRAVENOUS | Status: AC
  Administered 2021-02-10: 1809 mL via INTRAVENOUS

## 2021-02-10 MED ORDER — DEXTROSE 50 % IV SOLN
1.0000 | Freq: Once | INTRAVENOUS | Status: AC
  Administered 2021-02-10: 50 mL via INTRAVENOUS

## 2021-02-10 MED ORDER — PANTOPRAZOLE SODIUM 40 MG IV SOLR
40.0000 mg | Freq: Two times a day (BID) | INTRAVENOUS | Status: DC
  Administered 2021-02-10 – 2021-02-12 (×4): 40 mg via INTRAVENOUS
  Filled 2021-02-10 (×4): qty 40

## 2021-02-10 MED ORDER — SODIUM BICARBONATE 8.4 % IV SOLN
INTRAVENOUS | Status: DC
  Filled 2021-02-10: qty 1000

## 2021-02-10 MED ORDER — ROCURONIUM BROMIDE 10 MG/ML (PF) SYRINGE
PREFILLED_SYRINGE | INTRAVENOUS | Status: AC
  Filled 2021-02-10: qty 10

## 2021-02-10 MED ORDER — VECURONIUM BROMIDE 10 MG IV SOLR
INTRAVENOUS | Status: AC
  Filled 2021-02-10: qty 10

## 2021-02-10 MED ORDER — DEXTROSE IN LACTATED RINGERS 5 % IV SOLN: INTRAVENOUS | Status: DC

## 2021-02-10 MED ORDER — MIDAZOLAM HCL 2 MG/2ML IJ SOLN
INTRAMUSCULAR | Status: AC
  Filled 2021-02-10: qty 2

## 2021-02-10 MED ORDER — LIDOCAINE HCL (CARDIAC) PF 100 MG/5ML IV SOSY
PREFILLED_SYRINGE | INTRAVENOUS | Status: AC
  Filled 2021-02-10: qty 5

## 2021-02-10 MED ORDER — FENTANYL CITRATE (PF) 100 MCG/2ML IJ SOLN: 50.0000 ug | INTRAMUSCULAR | Status: DC | PRN

## 2021-02-10 MED ORDER — SUCCINYLCHOLINE CHLORIDE 200 MG/10ML IV SOSY
PREFILLED_SYRINGE | INTRAVENOUS | Status: AC
  Administered 2021-02-10: 100 mg via INTRAVENOUS
  Filled 2021-02-10: qty 10

## 2021-02-10 MED ORDER — SODIUM CHLORIDE 0.9 % IV BOLUS
1000.0000 mL | Freq: Once | INTRAVENOUS | Status: AC
  Administered 2021-02-10: 1000 mL via INTRAVENOUS

## 2021-02-10 MED ORDER — DOCUSATE SODIUM 100 MG PO CAPS
100.0000 mg | ORAL_CAPSULE | Freq: Two times a day (BID) | ORAL | Status: DC | PRN
  Administered 2021-02-13: 100 mg via ORAL
  Filled 2021-02-10: qty 1

## 2021-02-10 MED ORDER — POLYETHYLENE GLYCOL 3350 17 G PO PACK: 17.0000 g | PACK | Freq: Every day | ORAL | Status: DC | PRN

## 2021-02-10 MED ORDER — PROPOFOL 500 MG/50ML IV EMUL
INTRAVENOUS | Status: AC
  Filled 2021-02-10: qty 50

## 2021-02-10 MED ORDER — SUCCINYLCHOLINE CHLORIDE 20 MG/ML IJ SOLN: 100.0000 mg | Freq: Once | INTRAMUSCULAR | Status: AC

## 2021-02-10 MED ORDER — DEXTROSE 50 % IV SOLN
INTRAVENOUS | Status: AC
  Filled 2021-02-10: qty 50

## 2021-02-10 MED ORDER — ETOMIDATE 2 MG/ML IV SOLN
INTRAVENOUS | Status: AC
  Administered 2021-02-10: 20 mg via INTRAVENOUS
  Filled 2021-02-10: qty 20

## 2021-02-10 MED ORDER — FENTANYL CITRATE (PF) 100 MCG/2ML IJ SOLN
INTRAMUSCULAR | Status: AC
  Filled 2021-02-10: qty 2

## 2021-02-10 MED ORDER — ORAL CARE MOUTH RINSE
15.0000 mL | OROMUCOSAL | Status: DC
  Administered 2021-02-11 (×4): 15 mL via OROMUCOSAL

## 2021-02-10 MED ORDER — CHLORHEXIDINE GLUCONATE CLOTH 2 % EX PADS
6.0000 | MEDICATED_PAD | Freq: Every day | CUTANEOUS | Status: DC
  Administered 2021-02-12: 6 via TOPICAL

## 2021-02-10 MED ORDER — CHLORHEXIDINE GLUCONATE 0.12% ORAL RINSE (MEDLINE KIT)
15.0000 mL | Freq: Two times a day (BID) | OROMUCOSAL | Status: DC
  Administered 2021-02-10 – 2021-02-11 (×2): 15 mL via OROMUCOSAL

## 2021-02-10 MED ORDER — STERILE WATER FOR INJECTION IJ SOLN
INTRAMUSCULAR | Status: AC
  Filled 2021-02-10: qty 10

## 2021-02-10 MED ORDER — HYDROMORPHONE HCL 1 MG/ML IJ SOLN
1.0000 mg | Freq: Once | INTRAMUSCULAR | Status: AC
Start: 2021-02-10 — End: 2021-02-10
  Administered 2021-02-10: 1 mg via INTRAVENOUS
  Filled 2021-02-10: qty 1

## 2021-02-10 MED ORDER — ETOMIDATE 2 MG/ML IV SOLN: 20.0000 mg | Freq: Once | INTRAVENOUS | Status: AC

## 2021-02-10 MED ORDER — ENOXAPARIN SODIUM 40 MG/0.4ML ~~LOC~~ SOLN
40.0000 mg | SUBCUTANEOUS | Status: DC
  Administered 2021-02-10 – 2021-02-12 (×3): 40 mg via SUBCUTANEOUS
  Filled 2021-02-10 (×3): qty 0.4

## 2021-02-10 MED ORDER — PROPOFOL 1000 MG/100ML IV EMUL
5.0000 ug/kg/min | INTRAVENOUS | Status: DC
  Administered 2021-02-10: 5 ug/kg/min via INTRAVENOUS
  Administered 2021-02-10 – 2021-02-11 (×2): 55 ug/kg/min via INTRAVENOUS
  Administered 2021-02-11: 55.003 ug/kg/min via INTRAVENOUS
  Filled 2021-02-10 (×5): qty 100

## 2021-02-10 NOTE — Progress Notes (Signed)
Assisted with transporting PT to [facility] ED CT from [facility] [address] and back while on vent (100% fi02)- uneventful.

## 2021-02-10 NOTE — ED Notes (Signed)
Respiratory called for transport.

## 2021-02-10 NOTE — ED Provider Notes (Addendum)
[facility]-EMERGENCY DEPT Provider Note   CSN: [id number] Arrival date & time: 02/10/21  1553     History No chief complaint on file.   Kevin Stout is a 35 y.o. male.  Brought in by EMS for possible Benadryl overdose of unknown quantity.  Noted to be having seizures and received Versed per EMS.  I tried to call the number listed in his chart for his mother, and it was a wrong number.  He does have a piece of paper in his pocket that lists numbers for mental health services.  The history is provided by the EMS personnel. The history is limited by the condition of the patient.  Drug Overdose This is a new problem. Episode onset: unknown. The problem occurs constantly. The problem has been rapidly worsening. Nothing aggravates the symptoms. Nothing relieves the symptoms. He has tried nothing for the symptoms. The treatment provided no relief.       History reviewed. No pertinent past medical history.  There are no problems to display for this patient.   Past Surgical History:  Procedure Laterality Date  . HERNIA REPAIR    . ORIF TIBIA FRACTURE         History reviewed. No pertinent family history.  Social History   Tobacco Use  . Smoking status: Current Every Day Smoker    Packs/day: 5.00    Types: Cigarettes  . Smokeless tobacco: Never Used  Substance Use Topics  . Alcohol use: Yes    Comment: occ  . Drug use: No    Home Medications Prior to Admission medications   Medication Sig Start Date End Date Taking? Authorizing Provider  azithromycin (ZITHROMAX) 250 MG tablet Take 1 tablet (250 mg total) by mouth daily. Take first 2 tablets together, then 1 every day until finished. 08/03/18   Maxwell CaulLayden, Lindsey A, PA-C  HYDROcodone-acetaminophen (NORCO) 5-325 MG tablet Take 1 tablet by mouth every 6 (six) hours as needed for severe pain. 09/03/17   Molpus, John, MD  naproxen (NAPROSYN) 375 MG tablet Take 1 tablet twice daily as needed for pain.  09/03/17   Molpus, John, MD    Allergies    Vicodin hp [hydrocodone-acetaminophen]  Review of Systems   Review of Systems  Unable to perform ROS: Intubated    Physical Exam Updated Vital Signs BP (!) 149/101   Pulse (!) 145   Temp 98.7 F (37.1 C) (Oral)   Resp (!) 35   SpO2 93%   Physical Exam Constitutional:      Comments: Obtunded, snoring respirations  HENT:     Head: Normocephalic and atraumatic.     Nose: Nose normal.     Mouth/Throat:     Mouth: Mucous membranes are moist.     Comments: Poor dentition with many missing and cracked teeth Eyes:     Conjunctiva/sclera: Conjunctivae normal.     Pupils: Pupils are equal, round, and reactive to light.  Cardiovascular:     Rate and Rhythm: Regular rhythm. Tachycardia present.  Pulmonary:     Effort: Respiratory distress present.  Abdominal:     General: Abdomen is flat. There is no distension.  Musculoskeletal:        General: No swelling or deformity. Normal range of motion.     Cervical back: Normal range of motion.  Skin:    General: Skin is warm and dry.     Findings: No bruising or rash.  Neurological:     Mental Status: He is unresponsive.  GCS: GCS eye subscore is 3. GCS verbal subscore is 1. GCS motor subscore is 5.     ED Results / Procedures / Treatments   Labs (all labs ordered are listed, but only abnormal results are displayed) Labs Reviewed  ACETAMINOPHEN LEVEL - Abnormal; Notable for the following components:      Result Value   Acetaminophen (Tylenol), Serum <10 (*)    All other components within normal limits  COMPREHENSIVE METABOLIC PANEL - Abnormal; Notable for the following components:   CO2 20 (*)    Glucose, Bld 62 (*)    Calcium 8.1 (*)    Total Protein 6.4 (*)    All other components within normal limits  SALICYLATE LEVEL - Abnormal; Notable for the following components:   Salicylate Lvl <7.0 (*)    All other components within normal limits  LACTIC ACID, PLASMA - Abnormal;  Notable for the following components:   Lactic Acid, Venous 3.0 (*)    All other components within normal limits  RAPID URINE DRUG SCREEN, HOSP PERFORMED - Abnormal; Notable for the following components:   Benzodiazepines POSITIVE (*)    Amphetamines POSITIVE (*)    Tetrahydrocannabinol POSITIVE (*)    All other components within normal limits  URINALYSIS, ROUTINE W REFLEX MICROSCOPIC - Abnormal; Notable for the following components:   Protein, ur 30 (*)    All other components within normal limits  BLOOD GAS, ARTERIAL - Abnormal; Notable for the following components:   pH, Arterial 7.308 (*)    pCO2 arterial 23.2 (*)    pO2, Arterial 55.3 (*)    Bicarbonate 11.3 (*)    Acid-base deficit 13.7 (*)    All other components within normal limits  CBG MONITORING, ED - Abnormal; Notable for the following components:   Glucose-Capillary 61 (*)    All other components within normal limits  CBG MONITORING, ED - Abnormal; Notable for the following components:   Glucose-Capillary 50 (*)    All other components within normal limits  CBG MONITORING, ED - Abnormal; Notable for the following components:   Glucose-Capillary 127 (*)    All other components within normal limits  CBG MONITORING, ED - Abnormal; Notable for the following components:   Glucose-Capillary 53 (*)    All other components within normal limits  SARS CORONAVIRUS 2 (TAT 6-24 HRS)  ETHANOL  LACTIC ACID, PLASMA  CBC WITH DIFFERENTIAL/PLATELET  HIV ANTIBODY (ROUTINE TESTING W REFLEX)  CBC  BASIC METABOLIC PANEL  MAGNESIUM  PHOSPHORUS  TROPONIN I (HIGH SENSITIVITY)  TROPONIN I (HIGH SENSITIVITY)    EKG EKG Interpretation  Date/Time:  Tuesday February 10 2021 16:07:48 EDT Ventricular Rate:  142 PR Interval:  144 QRS Duration: 115 QT Interval:  412 QTC Calculation: 634 R Axis:   101 Text Interpretation: Sinus tachycardia Consider right atrial enlargement RBBB and LPFB Probable inferior infarct, old Probable  anterolateral infarct, old QTcprolonged no acute ischemia Confirmed by Pieter Partridge ([id number]) on 02/10/2021 6:28:43 PM   Radiology CT Head Wo Contrast  Result Date: 02/10/2021 CLINICAL DATA:  Altered level of consciousness, Benadryl overdose EXAM: CT HEAD WITHOUT CONTRAST TECHNIQUE: Contiguous axial images were obtained from the base of the skull through the vertex without intravenous contrast. COMPARISON:  11/07/2013 FINDINGS: Brain: No acute infarct or hemorrhage. Encephalomalacia within the right basal ganglia could be related to prior infarct or hemorrhage. Lateral ventricles and remaining midline structures are unremarkable. There are no acute extra-axial fluid collections. No mass effect. Vascular: No hyperdense vessel or unexpected  calcification. Skull: Normal. Negative for fracture or focal lesion. Sinuses/Orbits: Mucosal thickening within the right maxillary sinus, with superimposed gas fluid level. Mild mucosal thickening of the ethmoid air cells. Other: None. IMPRESSION: 1. No acute intracranial process. 2. 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 3. Right ethmoid and maxillary sinus disease. Electronically Signed   By: Sharlet Salina M.D.   On: 02/10/2021 17:59   DG Chest Port 1 View  Result Date: 02/10/2021 CLINICAL DATA:  Intubation. EXAM: PORTABLE CHEST 1 VIEW COMPARISON:  December 30, 2018. FINDINGS: The heart size and mediastinal contours are within normal limits. Endotracheal tube appears to be in grossly good position. Both lungs are clear. The visualized skeletal structures are unremarkable. IMPRESSION: Endotracheal tube in good position. No acute cardiopulmonary abnormality seen. Electronically Signed   By: Lupita Raider M.D.   On: 02/10/2021 17:39   DG Abd Portable 1V  Result Date: 02/10/2021 CLINICAL DATA:  NG tube placement EXAM: PORTABLE ABDOMEN - 1 VIEW COMPARISON:  None. FINDINGS: Esophageal tube tip and side port overlie the  mid stomach. Upper gas pattern is unremarkable IMPRESSION: Esophageal tube tip overlies the mid stomach. Electronically Signed   By: Jasmine Pang M.D.   On: 02/10/2021 19:37    Procedures Procedure Name: Intubation Date/Time: 02/10/2021 4:55 PM Performed by: Koleen Distance, MD Pre-anesthesia Checklist: Patient identified, Patient being monitored, Emergency Drugs available, Timeout performed and Suction available Oxygen Delivery Method: Ambu bag Preoxygenation: Pre-oxygenation with 100% oxygen Induction Type: Rapid sequence Ventilation: Mask ventilation without difficulty and Nasal airway inserted- appropriate to patient size Laryngoscope Size: Glidescope and 3 Grade View: Grade I Number of attempts: 1 Airway Equipment and Method: Video-laryngoscopy Placement Confirmation: ETT inserted through vocal cords under direct vision,  CO2 detector and Breath sounds checked- equal and bilateral Secured at: 24 cm Tube secured with: ETT holder Dental Injury: Teeth and Oropharynx as per pre-operative assessment      .Critical Care E&M Performed by: Koleen Distance, MD  Critical care provider statement:    Critical care time (minutes):  45   Critical care time was exclusive of:  Separately billable procedures and treating other patients and teaching time   Critical care was necessary to treat or prevent imminent or life-threatening deterioration of the following conditions:  CNS failure or compromise and toxidrome   Critical care was time spent personally by me on the following activities:  Discussions with consultants, evaluation of patient's response to treatment, examination of patient, obtaining history from patient or surrogate, ordering and performing treatments and interventions, ordering and review of laboratory studies, ordering and review of radiographic studies, pulse oximetry, re-evaluation of patient's condition, review of old charts and ventilator management   I assumed direction of  critical care for this patient from another provider in my specialty: no     Care discussed with: admitting provider   After initial E/M assessment, critical care services were subsequently performed that were exclusive of separately billable procedures or treatment.       Medications Ordered in ED Medications  propofol (DIPRIVAN) 500 MG/50ML infusion (has no administration in time range)  sterile water (preservative free) injection (has no administration in time range)  propofol (DIPRIVAN) 1000 MG/100ML infusion (has no administration in time range)  sodium chloride 0.9 % bolus 30 mL/kg (has no administration in time range)  etomidate (AMIDATE) injection 20 mg (20 mg Intravenous Given 02/10/21 1619)  succinylcholine (ANECTINE) injection 100 mg (100 mg Intravenous Given 02/10/21  1619)  sodium chloride 0.9 % bolus 1,000 mL (1,000 mLs Intravenous New Bag/Given 02/10/21 1621)    ED Course  I have reviewed the triage vital signs and the nursing notes.  Pertinent labs & imaging results that were available during my care of the patient were reviewed by me and considered in my medical decision making (see chart for details).  Clinical Course as of 02/10/21 1948  Tue Feb 10, 2021  7412 I spoke to critical care, and they will see the patient. [AW]    Clinical Course User Index [AW] Koleen Distance, MD   MDM Rules/Calculators/A&P                          Kevin Solian scented to the ED per EMS for possible drug overdose.  History was noncontributory secondary to his obtundation.  He was intubated immediately after I saw him secondary to concerns for airway protection.  UDS was positive for marijuana and amphetamine.  Benzodiazepine positivity could be attributable to his Versed dose per EMS.  QTc noted to be slightly prolonged.  No evidence of trauma or other emergent issue. He will be admitted for further evaluation and treatment. Final Clinical Impression(s) / ED Diagnoses Final diagnoses:   Drug overdose, undetermined intent, initial encounter    Rx / DC Orders ED Discharge Orders    None       Koleen Distance, MD 02/10/21 2324    Koleen Distance, MD 02/23/21 [phone]

## 2021-02-10 NOTE — ED Triage Notes (Signed)
BIBA Per EMS: [place] from gas station with an overdose on benadryl. Pt Took unknown amount. Pt had 2 seizures with EMS. 2.5 versed received. 16G L forearm. Vitals:  146/100 145 HR  97% 8L NRB

## 2021-02-10 NOTE — ED Notes (Signed)
Respiratory called to come take patient to CT.

## 2021-02-10 NOTE — Progress Notes (Signed)
Attempted to notify Lab that ABG being sent for analysis- phone call unsuccessful. Sent blood at approximately 1759.

## 2021-02-10 NOTE — ED Notes (Signed)
ED TO INPATIENT HANDOFF REPORT  Name/Age/Gender Kevin SolianJonathan D Stout 35 y.o. male  Code Status    Code Status Orders  (From admission, onward)         Start     Ordered   02/10/21 1927  Full code  Continuous        02/10/21 1928        Code Status History    This patient has a current code status but no historical code status.   Advance Care Planning Activity      Home/SNF/Other Home  Chief Complaint Drug overdose [T50.901A]  Level of Care/Admitting Diagnosis ED Disposition    ED Disposition Condition Comment   Admit  [facility]ospital [facility]: [facility] [[id number]]  Level of Care: ICU [6]  [facility] to [facility] or [facility] if equivalent level of care is available:: Yes  Covid Evaluation: Asymptomatic Screening Protocol (No Symptoms)  Diagnosis: Drug overdose [202575]  Admitting Physician: Martina SinnerEWALD, Almalik B [[id number]]  Attending Physician: Martina SinnerEWALD, Draper B [[id number]]  Estimated length of stay: past midnight tomorrow  Certification:: I certify this patient will need inpatient services for at least 2 midnights       Medical History History reviewed. No pertinent past medical history.  Allergies Allergies  Allergen Reactions  . Vicodin Hp [Hydrocodone-Acetaminophen] Itching    Per 11/06/13 [facility] in [facility]    IV Location/Drains/Wounds Patient Lines/Drains/Airways Status    Active Line/Drains/Airways    Name Placement date Placement time Site Days   Peripheral IV 02/10/21 Anterior;Left Forearm 02/10/21  1604  Forearm  less than 1   Peripheral IV 02/10/21 Right Forearm 02/10/21  1659  Forearm  less than 1   Peripheral IV 02/10/21 Right Hand 02/10/21  1659  Hand  less than 1   NG/OG Tube Orogastric 16 Fr. Center mouth Aucultation Measured external length of tube 02/10/21  1915  Center mouth  less than 1   Urethral Catheter Abby, RN Temperature probe 16 Fr. 02/10/21  1644  Temperature probe  less than 1   Airway 7.5 mm  02/10/21  1625  -- less than 1          Labs/Imaging Results for orders placed or performed during the hospital encounter of 02/10/21 (from the past 48 hour(s))  CBG monitoring, ED     Status: Abnormal   Collection Time: 02/10/21  4:10 PM  Result Value Ref Range   Glucose-Capillary 61 (L) 70 - 99 mg/dL    Comment: Glucose reference range applies only to samples taken after fasting for at least 8 hours.  Comprehensive metabolic panel     Status: Abnormal   Collection Time: 02/10/21  4:29 PM  Result Value Ref Range   Sodium 138 135 - 145 mmol/L   Potassium 3.8 3.5 - 5.1 mmol/L   Chloride 108 98 - 111 mmol/L   CO2 20 (L) 22 - 32 mmol/L   Glucose, Bld 62 (L) 70 - 99 mg/dL    Comment: Glucose reference range applies only to samples taken after fasting for at least 8 hours.   BUN 19 6 - 20 mg/dL   Creatinine, Ser 1.910.92 0.61 - 1.24 mg/dL   Calcium 8.1 (L) 8.9 - 10.3 mg/dL   Total Protein 6.4 (L) 6.5 - 8.1 g/dL   Albumin 3.9 3.5 - 5.0 g/dL   AST 16 15 - 41 U/L   ALT 9 0 - 44 U/L   Alkaline Phosphatase 50 38 - 126  U/L   Total Bilirubin 0.4 0.3 - 1.2 mg/dL   GFR, Estimated >16 >10 mL/min    Comment: (NOTE) Calculated using the CKD-EPI Creatinine Equation (2021)    Anion gap 10 5 - 15    Comment: Performed at [facility], [address] [place], [place] [address]  Troponin I (High Sensitivity)     Status: None   Collection Time: 02/10/21  4:29 PM  Result Value Ref Range   Troponin I (High Sensitivity) <2 <18 ng/L    Comment: (NOTE) Elevated high sensitivity troponin I (hsTnI) values and significant  changes across serial measurements may suggest ACS but many other  chronic and acute conditions are known to elevate hsTnI results.  Refer to the "Links" section for chest pain algorithms and additional  guidance. Performed at [facility], [address] [place], [place] [address]   Lactic acid, plasma     Status: Abnormal   Collection Time:  02/10/21  4:29 PM  Result Value Ref Range   Lactic Acid, Venous 3.0 (HH) 0.5 - 1.9 mmol/L    Comment: CRITICAL RESULT CALLED TO, READ BACK BY AND VERIFIED WITH: HAMILTON,L. RN @1816  ON 04.12.2022 BY COHEN,K Performed at [facility], [address], [place], [place]   CBC with Differential     Status: None   Collection Time: 02/10/21  4:29 PM  Result Value Ref Range   WBC 7.4 4.0 - 10.5 K/uL   RBC 4.52 4.22 - 5.81 MIL/uL   Hemoglobin 14.1 13.0 - 17.0 g/dL   HCT 04/12/21 82.9 - 56.2 %   MCV 89.8 80.0 - 100.0 fL   MCH 31.2 26.0 - 34.0 pg   MCHC 34.7 30.0 - 36.0 g/dL   RDW 13.0 86.5 - 78.4 %   Platelets 238 150 - 400 K/uL   nRBC 0.0 0.0 - 0.2 %   Neutrophils Relative % 69 %   Neutro Abs 5.0 1.7 - 7.7 K/uL   Lymphocytes Relative 20 %   Lymphs Abs 1.5 0.7 - 4.0 K/uL   Monocytes Relative 10 %   Monocytes Absolute 0.8 0.1 - 1.0 K/uL   Eosinophils Relative 1 %   Eosinophils Absolute 0.1 0.0 - 0.5 K/uL   Basophils Relative 0 %   Basophils Absolute 0.0 0.0 - 0.1 K/uL   Immature Granulocytes 0 %   Abs Immature Granulocytes 0.03 0.00 - 0.07 K/uL    Comment: Performed at [facility], [address], [place], [place]  Acetaminophen level     Status: Abnormal   Collection Time: 02/10/21  4:35 PM  Result Value Ref Range   Acetaminophen (Tylenol), Serum <10 (L) 10 - 30 ug/mL    Comment: (NOTE) Therapeutic concentrations vary significantly. A range of 10-30 ug/mL  may be an effective concentration for many patients. However, some  are best treated at concentrations outside of this range. Acetaminophen concentrations >150 ug/mL at 4 hours after ingestion  and >50 ug/mL at 12 hours after ingestion are often associated with  toxic reactions.  Performed at [facility], [address], [place], [place]   Ethanol     Status: None   Collection Time: 02/10/21  4:35 PM  Result Value Ref Range   Alcohol, Ethyl  (B) <10 <10 mg/dL    Comment: (NOTE) Lowest detectable limit for serum alcohol is 10 mg/dL.  For medical purposes only. Performed at [facility], [address] [place], [place] [address]   Salicylate level     Status: Abnormal   Collection Time: 02/10/21  4:35 PM  Result Value Ref Range   Salicylate Lvl <7.0 (L) 7.0 - 30.0 mg/dL    Comment: Performed at [facility], [address], [place], [place] [address]  Urine rapid drug screen (hosp performed)     Status: Abnormal   Collection Time: 02/10/21  4:35 PM  Result Value Ref Range   Opiates NONE DETECTED NONE DETECTED   Cocaine NONE DETECTED NONE DETECTED   Benzodiazepines POSITIVE (A) NONE DETECTED   Amphetamines POSITIVE (A) NONE DETECTED   Tetrahydrocannabinol POSITIVE (A) NONE DETECTED   Barbiturates NONE DETECTED NONE DETECTED    Comment: (NOTE) DRUG SCREEN FOR MEDICAL PURPOSES ONLY.  IF CONFIRMATION IS NEEDED FOR ANY PURPOSE, NOTIFY LAB WITHIN 5 DAYS.  LOWEST DETECTABLE LIMITS FOR URINE DRUG SCREEN Drug Class                     Cutoff (ng/mL) Amphetamine and metabolites    1000 Barbiturate and metabolites    200 Benzodiazepine                 200 Tricyclics and metabolites     300 Opiates and metabolites        300 Cocaine and metabolites        300 THC                            50 Performed at [facility], [address], [place], [place] [address]   Urinalysis, Routine w reflex microscopic Urine, Catheterized     Status: Abnormal   Collection Time: 02/10/21  4:35 PM  Result Value Ref Range   Color, Urine YELLOW YELLOW   APPearance CLEAR CLEAR   Specific Gravity, Urine 1.028 1.005 - 1.030   pH 5.0 5.0 - 8.0   Glucose, UA NEGATIVE NEGATIVE mg/dL   Hgb urine dipstick NEGATIVE NEGATIVE   Bilirubin Urine NEGATIVE NEGATIVE   Ketones, ur NEGATIVE NEGATIVE mg/dL   Protein, ur 30 (A) NEGATIVE mg/dL   Nitrite NEGATIVE NEGATIVE   Leukocytes,Ua NEGATIVE  NEGATIVE   RBC / HPF 0-5 0 - 5 RBC/hpf   WBC, UA 0-5 0 - 5 WBC/hpf   Bacteria, UA NONE SEEN NONE SEEN   Mucus PRESENT    Hyaline Casts, UA PRESENT     Comment: Performed at [facility], [address] [place], [place] [address]  CBG monitoring, ED     Status: Abnormal   Collection Time: 02/10/21  4:49 PM  Result Value Ref Range   Glucose-Capillary 50 (L) 70 - 99 mg/dL    Comment: Glucose reference range applies only to samples taken after fasting for at least 8 hours.  CBG monitoring, ED     Status: Abnormal   Collection Time: 02/10/21  5:23 PM  Result Value Ref Range   Glucose-Capillary 127 (H) 70 - 99 mg/dL    Comment: Glucose reference range applies only to samples taken after fasting for at least 8 hours.  Blood gas, arterial     Status: Abnormal   Collection Time: 02/10/21  6:00 PM  Result Value Ref Range   FIO2 100.00    Delivery systems VENTILATOR    Mode PRESSURE REGULATED VOLUME CONTROL    VT 560 mL   LHR 20 resp/min   Peep/cpap 5.0 cm H20  pH, Arterial 7.308 (L) 7.350 - 7.450   pCO2 arterial 23.2 (L) 32.0 - 48.0 mmHg   pO2, Arterial 55.3 (L) 83.0 - 108.0 mmHg   Bicarbonate 11.3 (L) 20.0 - 28.0 mmol/L   Acid-base deficit 13.7 (H) 0.0 - 2.0 mmol/L   O2 Saturation 85.0 %   Patient temperature 98.6    Collection site RIGHT BRACHIAL    Drawn by [id number]    Sample type ARTERIAL     Comment: Performed at [facility], [address] [place] [address], [place], [place] [address]  Lactic acid, plasma     Status: None   Collection Time: 02/10/21  6:29 PM  Result Value Ref Range   Lactic Acid, Venous 1.1 0.5 - 1.9 mmol/L    Comment: Performed at [facility], [address], [place], [place] [address]  Troponin I (High Sensitivity)     Status: None   Collection Time: 02/10/21  6:29 PM  Result Value Ref Range   Troponin I (High Sensitivity) 2 <18 ng/L    Comment: (NOTE) Elevated high sensitivity troponin I (hsTnI) values and  significant  changes across serial measurements may suggest ACS but many other  chronic and acute conditions are known to elevate hsTnI results.  Refer to the "Links" section for chest pain algorithms and additional  guidance. Performed at [facility], [address], [place], [place] [address]   CBG monitoring, ED     Status: Abnormal   Collection Time: 02/10/21  7:25 PM  Result Value Ref Range   Glucose-Capillary 53 (L) 70 - 99 mg/dL    Comment: Glucose reference range applies only to samples taken after fasting for at least 8 hours.   CT Head Wo Contrast  Result Date: 02/10/2021 CLINICAL DATA:  Altered level of consciousness, Benadryl overdose EXAM: CT HEAD WITHOUT CONTRAST TECHNIQUE: Contiguous axial images were obtained from the base of the skull through the vertex without intravenous contrast. COMPARISON:  11/07/2013 FINDINGS: Brain: No acute infarct or hemorrhage. Encephalomalacia within the right basal ganglia could be related to prior infarct or hemorrhage. Lateral ventricles and remaining midline structures are unremarkable. There are no acute extra-axial fluid collections. No mass effect. Vascular: No hyperdense vessel or unexpected calcification. Skull: Normal. Negative for fracture or focal lesion. Sinuses/Orbits: Mucosal thickening within the right maxillary sinus, with superimposed gas fluid level. Mild mucosal thickening of the ethmoid air cells. Other: None. IMPRESSION: 1. No acute intracranial process. 2. 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 3. Right ethmoid and maxillary sinus disease. Electronically Signed   By: Sharlet Salina M.D.   On: 02/10/2021 17:59   DG Chest Port 1 View  Result Date: 02/10/2021 CLINICAL DATA:  Intubation. EXAM: PORTABLE CHEST 1 VIEW COMPARISON:  December 30, 2018. FINDINGS: The heart size and mediastinal contours are within normal limits. Endotracheal tube appears to be in  grossly good position. Both lungs are clear. The visualized skeletal structures are unremarkable. IMPRESSION: Endotracheal tube in good position. No acute cardiopulmonary abnormality seen. Electronically Signed   By: Lupita Raider M.D.   On: 02/10/2021 17:39   DG Abd Portable 1V  Result Date: 02/10/2021 CLINICAL DATA:  NG tube placement EXAM: PORTABLE ABDOMEN - 1 VIEW COMPARISON:  None. FINDINGS: Esophageal tube tip and side port overlie the mid stomach. Upper gas pattern is unremarkable IMPRESSION: Esophageal tube tip overlies the mid stomach. Electronically Signed   By: Jasmine Pang M.D.   On: 02/10/2021 19:37  Pending Labs Unresulted Labs (From admission, onward)          Start     Ordered   02/17/21 0500  Creatinine, serum  (enoxaparin (LOVENOX)    CrCl >/= 30 ml/min)  Weekly,   R     Comments: while on enoxaparin therapy    02/10/21 1928   02/11/21 0500  CBC  Tomorrow morning,   R        02/10/21 1928   02/11/21 0500  Basic metabolic panel  Tomorrow morning,   R        02/10/21 1928   02/11/21 0500  Magnesium  Tomorrow morning,   R        02/10/21 1928   02/11/21 0500  Phosphorus  Tomorrow morning,   R        02/10/21 1928   02/10/21 1925  HIV Antibody (routine testing w rflx)  (HIV Antibody (Routine testing w reflex) panel)  Once,   STAT        02/10/21 1928   02/10/21 1630  SARS CORONAVIRUS 2 (TAT 6-24 HRS) Nasopharyngeal Nasopharyngeal Swab  (Tier 3 - Symptomatic/asymptomatic)  Once,   STAT       Question Answer Comment  Is this test for diagnosis or screening Screening   Symptomatic for COVID-19 as defined by CDC No   Hospitalized for COVID-19 No   Admitted to ICU for COVID-19 No   Previously tested for COVID-19 No   Resident in a congregate (group) care setting No   Employed in healthcare setting No   Has patient completed COVID vaccination(s) (2 doses of Pfizer/Moderna 1 dose of [employer]) Unknown      02/10/21 1629          Vitals/Pain Today's  Vitals   02/10/21 2030 02/10/21 2045 02/10/21 2100 02/10/21 2115  BP: 117/87 112/86 (!) 139/105 113/86  Pulse: 80 79 79 82  Resp: 20 20 16 20   Temp: 97.6 F (36.4 C) 97.8 F (36.6 C) 97.9 F (36.6 C) 97.8 F (36.6 C)  TempSrc:      SpO2: 100% 100% 100% 100%  Weight:      Height:        Isolation Precautions No active isolations  Medications Medications  propofol (DIPRIVAN) 1000 MG/100ML infusion (55 mcg/kg/min  60.3 kg Intravenous Infusion Verify 02/10/21 1746)  docusate sodium (COLACE) capsule 100 mg (has no administration in time range)  polyethylene glycol (MIRALAX / GLYCOLAX) packet 17 g (has no administration in time range)  enoxaparin (LOVENOX) injection 40 mg (40 mg Subcutaneous Given 02/10/21 2105)  pantoprazole (PROTONIX) injection 40 mg (40 mg Intravenous Given 02/10/21 2105)  fentaNYL (SUBLIMAZE) injection 50-100 mcg (has no administration in time range)  dextrose 5 % in lactated ringers infusion ( Intravenous New Bag/Given 02/10/21 2015)  etomidate (AMIDATE) injection 20 mg (20 mg Intravenous Given 02/10/21 1619)  succinylcholine (ANECTINE) injection 100 mg (100 mg Intravenous Given 02/10/21 1619)  sodium chloride 0.9 % bolus 1,000 mL (0 mLs Intravenous Stopped 02/10/21 1705)  sodium chloride 0.9 % bolus 1,809 mL (0 mL/kg  60.3 kg Intravenous Stopped 02/10/21 1821)  dextrose 50 % solution 50 mL (50 mLs Intravenous Given 02/10/21 1652)  HYDROmorphone (DILAUDID) injection 1 mg (1 mg Intravenous Given 02/10/21 1719)    Mobility non-ambulatory

## 2021-02-10 NOTE — ED Notes (Signed)
Vitals up to date. SBAR in chart. Thersa Salt, RN messaged.

## 2021-02-10 NOTE — ED Notes (Addendum)
Geraldine Contras, RT, [place], EDP, Abby, RN, Duwayne Heck, Manufacturing systems engineer, RN at bedside for intubation.

## 2021-02-10 NOTE — ED Notes (Signed)
Pt. Belongings:   Pt. Belongings are labeled with the pt.   1- Black Wallet 1- Driver License 1- Blue/Black cell phone 1- Paper with phone numbers 1- Pair of brown glasses 1- T-shirt (Black/White) cut off and thrown in the trash 1- Black and White acid wash jeans 1- Tan belt 1- Pair of black/white nike airmax shoes 1- Pair of white socks

## 2021-02-10 NOTE — H&P (Signed)
NAME:  Kevin Stout, MRN:  [id number], DOB:  10-10-1986, LOS: 0 ADMISSION DATE:  02/10/2021, CONSULTATION DATE:  02/10/21 REFERRING MD: Pieter Partridge CHIEF COMPLAINT:  Drug Overdose  History of Present Illness:  Kevin Stout is a 35 year old male with unknown medical history who was brought to the [facility] ER [place] by EMS for an overdose on benadryl. Per EMS report he had 2 seizure episodes that were treated with versed. He was intubated upon arrival in the ER due to somnolence and inability to protect his airway.  PCCM was called to admit the patient.   Initial EKG in ER showed QRS interval of 115 and QTc interval of 634. Most recent EKG shows QRS 99 and QTc 500. Glucose was 61, 50, 127 and most recently 53. Tylenol level <10, salicylate level <7. Urine drug screen is positive for amphetamines, benzodizepines (likely via EMS) and THC.        Pertinent  Medical History  Unknown  Significant Hospital Events: Including procedures, antibiotic start and stop dates in addition to other pertinent events   . ET tube 4/12 >>  Interim History / Subjective:    Objective   Blood pressure (!) 138/103, pulse 86, temperature (!) 97.4 F (36.3 C), resp. rate 20, height 5\' 9"  (1.753 m), weight 60.3 kg, SpO2 100 %.    Vent Mode: PRVC FiO2 (%):  [100 %] 100 % Set Rate:  [20 bmp] 20 bmp Vt Set:  [560 mL] 560 mL PEEP:  [5 cmH20] 5 cmH20 Plateau Pressure:  [12 cmH20] 12 cmH20   Intake/Output Summary (Last 24 hours) at 02/10/2021 1936 Last data filed at 02/10/2021 1821 Gross per 24 hour  Intake 1819.89 ml  Output --  Net 1819.89 ml   Filed Weights   02/10/21 1645  Weight: 60.3 kg    Examination: General: thin male, sedated, no acute distress HENT: [place]/AT, dry mucous membranes, sclera anicteric, PERRL Lungs: clear to auscultation bilaterally. No wheezes or rhonchi. Cardiovascular: RRR. No murmurs, rubs or gallops Abdomen: soft, non-distended, bowel sounds present. Dark gastric  content draining from OG Extremities: warm, no edema Neuro: sedated GU: foley in place  Labs/imaging that I have personally reviewed  (right click and "Reselect all SmartList Selections" daily)  See above in HPI labs reviewed.  CXR 4/12 showing ET tube in proper location. Clear lung fields.  KUB 4/12 confirming OG in proper position.  Resolved Hospital Problem list     Assessment & Plan:   Drug Overdose Reported ingestion of diphenhydramine of unknown quantity. Amphetamines and THC on drug screen. - Monitor QTc and QRS intervals via serial EKGs - No need for bicarbonate infusion at this time given improvement on most recent EKG  Acute Encephalopathy Secondary to diphenhydramine overdose - intubated due to somnolence for airway protection - Head CT with no acute pathology  Acute Respiratory Failure due to inability to protect airway - continue mechanical ventilation - Propofol infusion for sedation and PRN fentanyl for analgesia - SBT in AM  Hypoglycemia - start D5LR at 40ml/hr - frequent glucose monitoring until stabilized on D5LR  Acute Metabolic Acidosis In setting of drug overdose - fluid resuscitation as above - salicylate level undetectable  Best practice (right click and "Reselect all SmartList Selections" daily)  Diet:  NPO Pain/Anxiety/Delirium protocol (if indicated): Yes (RASS goal 0) VAP protocol (if indicated): Yes DVT prophylaxis: LMWH GI prophylaxis: PPI Glucose control:  SSI No Central venous access:  N/A Arterial line:  N/A  Foley:  Yes, and it is still needed Mobility:  bed rest  PT consulted: N/A Last date of multidisciplinary goals of care discussion: Unable to reach patient's mother Code Status:  full code Disposition: ICU  Labs   CBC: Recent Labs  Lab 02/10/21 1629  WBC 7.4  NEUTROABS 5.0  HGB 14.1  HCT 40.6  MCV 89.8  PLT 238    Basic Metabolic Panel: Recent Labs  Lab 02/10/21 1629  NA 138  K 3.8  CL 108  CO2 20*   GLUCOSE 62*  BUN 19  CREATININE 0.92  CALCIUM 8.1*   GFR: Estimated Creatinine Clearance: 97.4 mL/min (by C-G formula based on SCr of 0.92 mg/dL). Recent Labs  Lab 02/10/21 1629 02/10/21 1829  WBC 7.4  --   LATICACIDVEN 3.0* 1.1    Liver Function Tests: Recent Labs  Lab 02/10/21 1629  AST 16  ALT 9  ALKPHOS 50  BILITOT 0.4  PROT 6.4*  ALBUMIN 3.9   No results for input(s): LIPASE, AMYLASE in the last 168 hours. No results for input(s): AMMONIA in the last 168 hours.  ABG    Component Value Date/Time   PHART 7.308 (L) 02/10/2021 1800   PCO2ART 23.2 (L) 02/10/2021 1800   PO2ART 55.3 (L) 02/10/2021 1800   HCO3 11.3 (L) 02/10/2021 1800   ACIDBASEDEF 13.7 (H) 02/10/2021 1800   O2SAT 85.0 02/10/2021 1800     Coagulation Profile: No results for input(s): INR, PROTIME in the last 168 hours.  Cardiac Enzymes: No results for input(s): CKTOTAL, CKMB, CKMBINDEX, TROPONINI in the last 168 hours.  HbA1C: No results found for: HGBA1C  CBG: Recent Labs  Lab 02/10/21 1610 02/10/21 1649 02/10/21 1723 02/10/21 1925  GLUCAP 61* 50* 127* 53*    Review of Systems:   Unable to obtain due to mental status.  Past Medical History:  He,  has no past medical history on file.   Surgical History:   Past Surgical History:  Procedure Laterality Date  . HERNIA REPAIR    . ORIF TIBIA FRACTURE       Social History:   reports that he has been smoking cigarettes. He has been smoking about 5.00 packs per day. He has never used smokeless tobacco. He reports current alcohol use. He reports that he does not use drugs.   Family History:  His family history is not on file.   Allergies Allergies  Allergen Reactions  . Vicodin Hp [Hydrocodone-Acetaminophen] Itching    Per 11/06/13 notes in computer from [facility] Medications  Prior to Admission medications   Medication Sig Start Date End Date Taking? Authorizing Provider  azithromycin (ZITHROMAX) 250 MG tablet  Take 1 tablet (250 mg total) by mouth daily. Take first 2 tablets together, then 1 every day until finished. 08/03/18   Maxwell Caul, PA-C  HYDROcodone-acetaminophen (NORCO) 5-325 MG tablet Take 1 tablet by mouth every 6 (six) hours as needed for severe pain. 09/03/17   Molpus, John, MD  naproxen (NAPROSYN) 375 MG tablet Take 1 tablet twice daily as needed for pain. 09/03/17   Molpus, Jonny Ruiz, MD     Critical care time: 45 minutes    Melody Comas, MD [facility] Office: [phone]   See Amion for personal pager PCCM on call pager [phone] until 7pm. Please call Elink 7p-7a. [phone]

## 2021-02-10 NOTE — ED Notes (Signed)
Poison Control called. Spoke to [place]. Anticholinergic. Urinary retention. QRS widening. Tachycardia, dysrhythmias, QTC prolongation. Recommendations to Give IV fluids, repeat EKG Q4 hours. Agitated or seizures, give benzos first then phenobarbs. Close eye on temperature, make sure no hyperthermic. Rapidly cool patient. Check magnesium and Potassium levels. 4.5 for K and 2.3 for Mag is the goal. Want QTC below 500. For any QRS prolongation, if greater than 140, sodium bicarb bolus. If it gets this wide give Poison control a call back.

## 2021-02-10 NOTE — ED Notes (Signed)
Pt fighting tube on of propofol. Pt continuing to reach for tube. Dr. Delford Field notified and verbal order for soft restraints given.

## 2021-02-11 DIAGNOSIS — T50902A Poisoning by unspecified drugs, medicaments and biological substances, intentional self-harm, initial encounter: Secondary | ICD-10-CM

## 2021-02-11 LAB — BASIC METABOLIC PANEL
Anion gap: 8 (ref 5–15)
BUN: 14 mg/dL (ref 6–20)
CO2: 19 mmol/L — ABNORMAL LOW (ref 22–32)
Calcium: 8.1 mg/dL — ABNORMAL LOW (ref 8.9–10.3)
Chloride: 110 mmol/L (ref 98–111)
Creatinine, Ser: 0.91 mg/dL (ref 0.61–1.24)
GFR, Estimated: >60 mL/min (ref >60)
Glucose, Bld: 76 mg/dL (ref 70–99)
Potassium: 3.3 mmol/L — ABNORMAL LOW (ref 3.5–5.1)
Sodium: 137 mmol/L (ref 135–145)

## 2021-02-11 LAB — GLUCOSE, CAPILLARY
Glucose-Capillary: 95 mg/dL (ref 70–99)
Glucose-Capillary: 88 mg/dL (ref 70–99)

## 2021-02-11 LAB — MAGNESIUM: Magnesium: 2 mg/dL (ref 1.7–2.4)

## 2021-02-11 LAB — CBC
HCT: 36.9 % — ABNORMAL LOW (ref 39.0–52.0)
Hemoglobin: 12.4 g/dL — ABNORMAL LOW (ref 13.0–17.0)
MCH: 31 pg (ref 26.0–34.0)
MCHC: 33.6 g/dL (ref 30.0–36.0)
MCV: 92.3 fL (ref 80.0–100.0)
Platelets: 183 10*3/uL (ref 150–400)
RBC: 4 MIL/uL — ABNORMAL LOW (ref 4.22–5.81)
RDW: 12.8 % (ref 11.5–15.5)
WBC: 8.3 10*3/uL (ref 4.0–10.5)
nRBC: 0 % (ref 0.0–0.2)

## 2021-02-11 LAB — HIV ANTIBODY (ROUTINE TESTING W REFLEX): HIV Screen 4th Generation wRfx: NONREACTIVE

## 2021-02-11 LAB — PHOSPHORUS: Phosphorus: 2.6 mg/dL (ref 2.5–4.6)

## 2021-02-11 LAB — CK: Total CK: 50 U/L (ref 49–397)

## 2021-02-11 LAB — SARS CORONAVIRUS 2 (TAT 6-24 HRS): SARS Coronavirus 2: NEGATIVE

## 2021-02-11 LAB — MRSA PCR SCREENING: MRSA by PCR: NEGATIVE

## 2021-02-11 MED ORDER — ORAL CARE MOUTH RINSE
15.0000 mL | Freq: Two times a day (BID) | OROMUCOSAL | Status: DC
  Administered 2021-02-12 – 2021-02-13 (×3): 15 mL via OROMUCOSAL

## 2021-02-11 MED ORDER — SODIUM CHLORIDE 0.9% FLUSH
10.0000 mL | Freq: Two times a day (BID) | INTRAVENOUS | Status: DC
  Administered 2021-02-11 – 2021-02-13 (×5): 10 mL via INTRAVENOUS

## 2021-02-11 MED ORDER — POTASSIUM CHLORIDE 10 MEQ/100ML IV SOLN
10.0000 meq | INTRAVENOUS | Status: AC
  Administered 2021-02-11 (×4): 10 meq via INTRAVENOUS
  Filled 2021-02-11 (×4): qty 100

## 2021-02-11 MED ORDER — POTASSIUM CHLORIDE 20 MEQ PO PACK
20.0000 meq | PACK | ORAL | Status: AC
  Administered 2021-02-11 (×2): 20 meq
  Filled 2021-02-11 (×2): qty 1

## 2021-02-11 MED ORDER — PHENOL 1.4 % MT LIQD
1.0000 | OROMUCOSAL | Status: DC | PRN
  Administered 2021-02-11: 1 via OROMUCOSAL
  Filled 2021-02-11: qty 177

## 2021-02-11 MED ORDER — ACETAMINOPHEN 325 MG PO TABS
650.0000 mg | ORAL_TABLET | Freq: Three times a day (TID) | ORAL | Status: DC | PRN
  Administered 2021-02-11 – 2021-02-12 (×2): 650 mg via ORAL
  Filled 2021-02-11 (×2): qty 2

## 2021-02-11 NOTE — Progress Notes (Signed)
After patient was extubated by RT per order, nurse was assessing patient's LOC and when asked if he had intended to harm himself by taking medications (that resulted in his admission to the hospital), patient stated "yes." MD made aware and safety precautions 1 to 1 observation initiated.  Patient resting comfortably at this time. Safety sitter at bedside.

## 2021-02-11 NOTE — Progress Notes (Signed)
NAME:  Kevin Stout, MRN:  [id number], DOB:  [birth year], LOS: 1 ADMISSION DATE:  02/10/2021, CONSULTATION DATE:  02/10/21 REFERRING MD: Pieter Partridge CHIEF COMPLAINT:  Drug Overdose  History of Present Illness:  Kevin Stout is a 35 year old male with unknown medical history who was brought to the [facility] ER on 4/12 from a gas station by EMS for an overdose on benadryl. Per EMS report he had 2 seizure episodes that were treated with versed. He was intubated upon arrival in the ER due to somnolence and inability to protect his airway. PCCM was called to admit the patient. He is currently in the [facility] ICU in critical condition.  Pertinent  Medical History  Unknown  Significant Hospital Events: Including procedures, antibiotic start and stop dates in addition to other pertinent events   ET tube 4/12 > 4/12 UDS> +Amphetamines, +Benzos (EMS), +THC  Interim History / Subjective:  OD on benadryl>EMS>[facility] ED> ETT> ICU  Documented 24 HR: Tmax  98.7 HR 75-145, RR 8-35, BP 110/79-157/111, +1.6 L, 550 UOP  Remains intubated  Prop 45mcg/kg DRLR at 50  Unable to obtain subjective history due to patient status  Objective   Blood pressure (!) 131/103, pulse 85, temperature 97.88 F (36.6 C), resp. rate 20, height 5\' 9"  (1.753 m), weight 61.5 kg, SpO2 100 %.    Vent Mode: PRVC FiO2 (%):  [40 %-100 %] 40 % Set Rate:  [20 bmp] 20 bmp Vt Set:  [560 mL] 560 mL PEEP:  [5 cmH20] 5 cmH20 Plateau Pressure:  [12 cmH20] 12 cmH20   Intake/Output Summary (Last 24 hours) at 02/11/2021 0753 Last data filed at 02/11/2021 02/13/2021 Gross per 24 hour  Intake 2261.51 ml  Output 600 ml  Net 1661.51 ml   Filed Weights   02/10/21 1645 02/10/21 2238 02/11/21 0443  Weight: 60.3 kg 61.5 kg 61.5 kg    Examination: General: Sedated, older appearing than age suggests, lying in bed, comfortable HEENT: MM pink/moist, PERRL 75mm, atraumatic, ETT, OGT Neuro: MAE, Purposeful, eyes open to voice, RASS -1 CV: s1s2, NSR, no  m/r/g appreciated PULM:  Trachea midline, chest expansion symmetric, clear lung sounds in upper/lowe lobes, air movement throughout GI: soft, bsx4 active, non distended  Extremities: warm/dry, no edema appreciated, cap refill <3 seconds Skin: no rashes, lesions, or wounds appreciated   Labs/imaging that I have personally reviewed  (right click and "Reselect all SmartList Selections" daily)  ABG, BMP, Glucose, 12 lead, Head CT, UDS, ETOH, Salicylate, Troponin, Lactate  Resolved Hospital Problem list     Assessment & Plan:  Drug Overdose Reported ingestion of diphenhydramine of unknown quantity. Amphetamines and THC on drug screen. -Repeat ECG now and at 1400 for QTC monitoring, will follow up -Continue Foley for UOP monitoring -Follow BMP/Mag/Phos, replete and replace as indicated, AM BMP -Follow up CK  Acute Metabolic Encephalopathy Seizure Secondary to diphenhydramine overdose, head CT negative for intercranial process -Continue PAD bundle with Propofol and fentanyl pushed, Goal RASS -1 to 0, wean sedation to goal -Conduct daily SAT -Delirium prevention precautions -Ensure normothermia  Acute Respiratory Failure due to inability to protect airway Weaning on ventilator currently -Continue mechanical ventilation -Daily SAT/SBT -If tolerates PS, will plan to extubate  Acute Metabolic Acidosis In setting of drug overdose, lactate 3> 1.1, no gap this AM -Continue supportive treatment with ventilation, fluid replacement, and monitoring -If unable to wean ventilator or continued AMS, will repeat ABG   Hypoglycemia -q6h BBG monitoring -d5lr gtt  Current Ciggarette  Smoking, 5 PPD -Cigarette cessation when appropriate   Best practice (right click and "Reselect all SmartList Selections" daily)  Diet:  NPO Pain/Anxiety/Delirium protocol (if indicated): Yes (RASS goal 0) VAP protocol (if indicated): Yes DVT prophylaxis: LMWH GI prophylaxis: PPI Glucose control:  SSI  No Central venous access:  N/A Arterial line:  N/A Foley:  Yes, and it is still needed Mobility:  bed rest  PT consulted: N/A Last date of multidisciplinary goals of care discussion: Will attempt to find contact today. Code Status:  full code Disposition: ICU   Critical care time: 33 minutes    Gershon Mussel., MSN, APRN, AGACNP-BC [facility]  02/11/2021 , 7:53 AM   Please see [email or web address] for pager details  From 7a-7p if no response, please call [phone] After hours, please call Elink at [phone]'

## 2021-02-11 NOTE — Progress Notes (Signed)
eLink Physician-Brief Progress Note Patient Name: Kevin Stout DOB: 1986/07/25 MRN: [id number]   Date of Service  02/11/2021  HPI/Events of Note  Patient c/o sore throat post extubation.  eICU Interventions  Plan: 1. Chloraseptic Spray 1 spray to throat PRN sore throat.      Intervention Category Major Interventions: Other:  Lenell Antu 02/11/2021, 8:33 PM

## 2021-02-11 NOTE — Procedures (Signed)
Extubation Procedure Note  Patient Details:   Name: Kevin Stout DOB: [birth year] MRN: [id number]   Airway Documentation:    Vent end date: 02/11/21 Vent end time: 1150   Evaluation  O2 sats: stable throughout Complications: No apparent complications Patient did tolerate procedure well. Bilateral Breath Sounds: Diminished,Clear   Yes   Pt was extubated at 1150 to 2L [place] per MD order. Pt was suctioned and had a positive cuff leak prior to extubation. Pt tolerated this well and was able to speak lowly afterwards. Pt had a good cough and stridor was not heard this time. Vitals are stable, RT will continue to monitor.   Arryn Terrones A Jeanette Rauth 02/11/2021, 11:53 AM

## 2021-02-11 NOTE — Progress Notes (Signed)
Kevin Stout from poison control called to check in on patient. She will check back around 6pm. No further recommendations a this time.

## 2021-02-11 NOTE — Progress Notes (Signed)
K+3.3 ?Replaced per protocol  ?

## 2021-02-12 DIAGNOSIS — E872 Acidosis: Secondary | ICD-10-CM

## 2021-02-12 DIAGNOSIS — E871 Hypo-osmolality and hyponatremia: Secondary | ICD-10-CM

## 2021-02-12 DIAGNOSIS — E876 Hypokalemia: Secondary | ICD-10-CM

## 2021-02-12 DIAGNOSIS — R5381 Other malaise: Secondary | ICD-10-CM

## 2021-02-12 DIAGNOSIS — R569 Unspecified convulsions: Secondary | ICD-10-CM

## 2021-02-12 DIAGNOSIS — G929 Unspecified toxic encephalopathy: Secondary | ICD-10-CM

## 2021-02-12 DIAGNOSIS — J9601 Acute respiratory failure with hypoxia: Secondary | ICD-10-CM

## 2021-02-12 LAB — BASIC METABOLIC PANEL
Anion gap: 7 (ref 5–15)
BUN: 6 mg/dL (ref 6–20)
CO2: 21 mmol/L — ABNORMAL LOW (ref 22–32)
Calcium: 8.2 mg/dL — ABNORMAL LOW (ref 8.9–10.3)
Chloride: 106 mmol/L (ref 98–111)
Creatinine, Ser: 0.78 mg/dL (ref 0.61–1.24)
GFR, Estimated: >60 mL/min (ref >60)
Glucose, Bld: 86 mg/dL (ref 70–99)
Potassium: 4.1 mmol/L (ref 3.5–5.1)
Sodium: 134 mmol/L — ABNORMAL LOW (ref 135–145)

## 2021-02-12 LAB — GLUCOSE, CAPILLARY
Glucose-Capillary: 112 mg/dL — ABNORMAL HIGH (ref 70–99)
Glucose-Capillary: 114 mg/dL — ABNORMAL HIGH (ref 70–99)
Glucose-Capillary: 127 mg/dL — ABNORMAL HIGH (ref 70–99)
Glucose-Capillary: 91 mg/dL (ref 70–99)

## 2021-02-12 LAB — MAGNESIUM: Magnesium: 1.9 mg/dL (ref 1.7–2.4)

## 2021-02-12 MED ORDER — SERTRALINE HCL 25 MG PO TABS
25.0000 mg | ORAL_TABLET | Freq: Every day | ORAL | Status: DC
  Administered 2021-02-12 – 2021-02-13 (×2): 25 mg via ORAL
  Filled 2021-02-12: qty 1

## 2021-02-12 MED ORDER — MENTHOL 3 MG MT LOZG
1.0000 | LOZENGE | OROMUCOSAL | Status: DC | PRN
  Administered 2021-02-12: 3 mg via ORAL
  Filled 2021-02-12: qty 9

## 2021-02-12 MED ORDER — MAGNESIUM SULFATE 2 GM/50ML IV SOLN
2.0000 g | Freq: Once | INTRAVENOUS | Status: AC
  Administered 2021-02-12: 2 g via INTRAVENOUS
  Filled 2021-02-12: qty 50

## 2021-02-12 MED ORDER — NICOTINE 14 MG/24HR TD PT24
14.0000 mg | MEDICATED_PATCH | Freq: Every day | TRANSDERMAL | Status: DC
  Administered 2021-02-12 – 2021-02-13 (×2): 14 mg via TRANSDERMAL
  Filled 2021-02-12 (×2): qty 1

## 2021-02-12 MED ORDER — PANTOPRAZOLE SODIUM 40 MG PO TBEC
40.0000 mg | DELAYED_RELEASE_TABLET | Freq: Two times a day (BID) | ORAL | Status: DC
  Administered 2021-02-12 – 2021-02-13 (×2): 40 mg via ORAL
  Filled 2021-02-12 (×2): qty 1

## 2021-02-12 NOTE — Progress Notes (Signed)
eLink Physician-Brief Progress Note Patient Name: Kevin Stout DOB: [birth year] MRN: [id number]   Date of Service  02/12/2021  HPI/Events of Note  Patient still c/o sore throat - Chloraseptic spray not working.   eICU Interventions  Plan: 1. D/C Chloraseptic spray. 2. Cepacol lozenges PRN throat pain.      Intervention Category Major Interventions: Other:  Lenell Antu 02/12/2021, 5:28 AM

## 2021-02-12 NOTE — Progress Notes (Signed)

## 2021-02-12 NOTE — Progress Notes (Addendum)
PROGRESS NOTE  Kevin Stout SWH:[id number] DOB: January 11, 1986   PCP: Patient, No Pcp Per (Inactive)  Patient is from: Home  DOA: 02/10/2021 LOS: 2  Chief complaints: Intentional overdose  Brief Narrative / Interim history: 35 year old M with PMH of marijuana use and tobacco use brought to ED by EMS after intentional overdose with unknown quantity of Benadryl, and admitted to ICU requiring intubation and mechanical ventilation for acute respiratory failure due to inability to protect his airway.  He also had 2 episodes of seizure enroute to ED per EMS report.  UDS positive for amphetamine, benzo and THC.  He was extubated on 02/11/2021, and transferred to hospitalist service on 02/12/2021.  Subjective: Seen and examined earlier this morning.  No major events overnight of this morning.  No complaints other than sore throat from intubation.  He denies chest pain, shortness of breath, GI, UTI or focal neuro symptoms.  He admits to intentional overdose with "NyQuil" but does not remember how many he took.  He thinks there is Benadryl in it. He reports some stressful family dynamics that have prompted him to do this.  He denies history of depression or mood disorder.  He now denies suicidal or homicidal ideation.  Objective: Vitals:   02/12/21 0958 02/12/21 1000 02/12/21 1100 02/12/21 1200  BP: 122/65 122/65    Pulse:  86 79 88  Resp:  (!) 25 (!) 27 (!) 25  Temp:    98.1 F (36.7 C)  TempSrc:    Oral  SpO2:  98% 99% 100%  Weight:      Height:        Intake/Output Summary (Last 24 hours) at 02/12/2021 1247 Last data filed at 02/12/2021 1228 Gross per 24 hour  Intake 279.7 ml  Output 3130 ml  Net -2850.3 ml   Filed Weights   02/10/21 2238 02/11/21 0443 02/12/21 0500  Weight: 61.5 kg 61.5 kg 61.4 kg    Examination:  GENERAL: No apparent distress.  Nontoxic. HEENT: MMM.  Vision and hearing grossly intact.  NECK: Supple.  No apparent JVD.  RESP: On RA.  No IWOB.  Fair aeration  bilaterally. CVS:  RRR. Heart sounds normal.  ABD/GI/GU: BS+. Abd soft, NTND.  MSK/EXT:  Moves extremities. No apparent deformity. No edema.  SKIN: no apparent skin lesion or wound NEURO: Awake, alert and oriented appropriately.  No apparent focal neuro deficit. PSYCH: Calm.  Denies suicidal or homicidal ideation.  Procedures:  Intubation and mechanical ventilation from 4/12-4/13  Microbiology summarized: COVID-19 and influenza PCR nonreactive.  Assessment & Plan: Intentional drug overdose/suicidal attempt-admits to taking unknown quantity of Benadryl with the intent to end his life in the setting of stressful home dynamics with "my boys mother".  Denies previous history of depression or mental illness.  UDS positive for amphetamine, benzo and THC although he denies amphetamine use. -Medically cleared -Psychiatry consulted -Continue one-to-one safety sitter   Acute  toxic encephalopathy: Likely due to the above.  Resolved. Seizure-reportedly had 2 seizure episodes in route to ED per EMS report.  No history of seizure. -Reorientation and delirium precautions -Continue seizure precautions. Doubt need for AED  Acute Respiratory Failure due to inability to protect airway after Benadryl overdose: Resolved.  Acute Metabolic Acidosis in the setting of overdose and lactic acidosis: Lactic acidosis resolved.  Metabolic acidosis improving.  Hyponatremia: Resolved.  Hypokalemia: Resolved.  Hypoglycemia: In the setting of the above.  Resolved. -Discontinue D5-LR  Tobacco use disorder: Reports smoking about half a pack a day.  -  Encourage cessation -Nicotine patch  Polysubstance use: UDS positive for amphetamine, benzo and THC.  He probably received benzos for seizure in route to ED.  He denies amphetamine use but not sure if he took DayQuil as well -Encouraged to avoid marijuana  Prolonged QTc-500>> 497. -Avoid QT prolonging drugs -Optimize K and Mg. -Continue telemetry  monitoring  Debility/physical deconditioning -PT/OT eval  Body mass index is 19.99 kg/m.         DVT prophylaxis:  enoxaparin (LOVENOX) injection 40 mg Start: 02/10/21 2200  Code Status: Full code Family Communication: Patient and/or RN. None per patient's wish Level of care: Telemetry Status is: Inpatient  Remains inpatient appropriate because:Unsafe d/c plan   Dispo: The patient is from: Home              Anticipated d/c is to: To be determined              Patient currently is medically stable to d/c.   Difficult to place patient No       Consultants:  Pulmonology-signed off Psychiatry   Sch Meds:  Scheduled Meds: . Chlorhexidine Gluconate Cloth  6 each Topical Daily  . enoxaparin (LOVENOX) injection  40 mg Subcutaneous Q24H  . mouth rinse  15 mL Mouth Rinse BID  . pantoprazole (PROTONIX) IV  40 mg Intravenous Q12H  . sodium chloride flush  10 mL Intravenous Q12H   Continuous Infusions: . dextrose 5% lactated ringers 50 mL/hr at 02/12/21 1141   PRN Meds:.acetaminophen, docusate sodium, menthol-cetylpyridinium, polyethylene glycol  Antimicrobials: Anti-infectives (From admission, onward)   None       I have personally reviewed the following labs and images: CBC: Recent Labs  Lab 02/10/21 1629 02/11/21 0302  WBC 7.4 8.3  NEUTROABS 5.0  --   HGB 14.1 12.4*  HCT 40.6 36.9*  MCV 89.8 92.3  PLT 238 183   BMP &GFR Recent Labs  Lab 02/10/21 1629 02/11/21 0302 02/12/21 0303  NA 138 137 134*  K 3.8 3.3* 4.1  CL 108 110 106  CO2 20* 19* 21*  GLUCOSE 62* 76 86  BUN 19 14 6   CREATININE 0.92 0.91 0.78  CALCIUM 8.1* 8.1* 8.2*  MG  --  2.0 1.9  PHOS  --  2.6  --    Estimated Creatinine Clearance: 114.1 mL/min (by C-G formula based on SCr of 0.78 mg/dL). Liver & Pancreas: Recent Labs  Lab 02/10/21 1629  AST 16  ALT 9  ALKPHOS 50  BILITOT 0.4  PROT 6.4*  ALBUMIN 3.9   No results for input(s): LIPASE, AMYLASE in the last 168  hours. No results for input(s): AMMONIA in the last 168 hours. Diabetic: No results for input(s): HGBA1C in the last 72 hours. Recent Labs  Lab 02/10/21 1925 02/11/21 1155 02/11/21 1811 02/12/21 0002 02/12/21 0619  GLUCAP 53* 95 88 91 112*   Cardiac Enzymes: Recent Labs  Lab 02/11/21 0849  CKTOTAL 50   No results for input(s): PROBNP in the last 8760 hours. Coagulation Profile: No results for input(s): INR, PROTIME in the last 168 hours. Thyroid Function Tests: No results for input(s): TSH, T4TOTAL, FREET4, T3FREE, THYROIDAB in the last 72 hours. Lipid Profile: No results for input(s): CHOL, HDL, LDLCALC, TRIG, CHOLHDL, LDLDIRECT in the last 72 hours. Anemia Panel: No results for input(s): VITAMINB12, FOLATE, FERRITIN, TIBC, IRON, RETICCTPCT in the last 72 hours. Urine analysis:    Component Value Date/Time   COLORURINE YELLOW 02/10/2021 1635   APPEARANCEUR CLEAR 02/10/2021 1635  LABSPEC 1.028 02/10/2021 1635   PHURINE 5.0 02/10/2021 1635   GLUCOSEU NEGATIVE 02/10/2021 1635   HGBUR NEGATIVE 02/10/2021 1635   BILIRUBINUR NEGATIVE 02/10/2021 1635   KETONESUR NEGATIVE 02/10/2021 1635   PROTEINUR 30 (A) 02/10/2021 1635   NITRITE NEGATIVE 02/10/2021 1635   LEUKOCYTESUR NEGATIVE 02/10/2021 1635   Sepsis Labs: Invalid input(s): PROCALCITONIN, LACTICIDVEN  Microbiology: Recent Results (from the past 240 hour(s))  SARS CORONAVIRUS 2 (TAT 6-24 HRS) Nasopharyngeal Nasopharyngeal Swab     Status: None   Collection Time: 02/10/21  4:28 PM   Specimen: Nasopharyngeal Swab  Result Value Ref Range Status   SARS Coronavirus 2 NEGATIVE NEGATIVE Final    Comment: (NOTE) SARS-CoV-2 target nucleic acids are NOT DETECTED.  The SARS-CoV-2 RNA is generally detectable in upper and lower respiratory specimens during the acute phase of infection. Negative results do not preclude SARS-CoV-2 infection, do not rule out co-infections with other pathogens, and should not be used as  the sole basis for treatment or other patient management decisions. Negative results must be combined with clinical observations, patient history, and epidemiological information. The expected result is Negative.  Fact Sheet for Patients: HairSlick.no  Fact Sheet for Healthcare Providers: quierodirigir.com  This test is not yet approved or cleared by the [place] FDA and  has been authorized for detection and/or diagnosis of SARS-CoV-2 by FDA under an Emergency Use Authorization (EUA). This EUA will remain  in effect (meaning this test can be used) for the duration of the COVID-19 declaration under Se ction 564(b)(1) of the Act, 21 U.S.C. section 360bbb-3(b)(1), unless the authorization is terminated or revoked sooner.  Performed at [facility], [address] [place], [place] [address]   MRSA PCR Screening     Status: None   Collection Time: 02/11/21 12:13 AM   Specimen: Nasal Mucosa; Nasopharyngeal  Result Value Ref Range Status   MRSA by PCR NEGATIVE NEGATIVE Final    Comment:        The GeneXpert MRSA Assay (FDA approved for NASAL specimens only), is one component of a comprehensive MRSA colonization surveillance program. It is not intended to diagnose MRSA infection nor to guide or monitor treatment for MRSA infections. Performed at [facility], [address] [place], [place] [address]     Radiology Studies: No results found.    Meyah Corle T. Flynn Lininger [facility]  If 7PM-7AM, please contact night-coverage [email or web address] 02/12/2021, 12:47 PM

## 2021-02-12 NOTE — Consult Note (Signed)
[facility] Face-to-Face Psychiatry Consult   Reason for Consult:  Suicide Attempt Referring Physician:  Dr. Alanda Slim Patient Identification: Kevin Stout MRN:  [id number] Principal Diagnosis: <principal problem not specified> Diagnosis:  Active Problems:   Drug overdose   Total Time spent with patient: 30 minutes  Subjective:   Kevin Stout is a 35 y.o. male patient admitted with suicide attempt by overdose. Patient admits to recent intentional suicide attempt by overdosing on a bottle of sleep aid. "I was just hoping I could go to sleep and not wake up. My two supports kind of left me with no where to go, and then my Marjo Bicker mother said I couldn't see my kids. I felt like I had no one to turn too. " He endorses symptoms of depression prior to his attempt that include hopelessness, worthlessness, despondent, sadness, insomnia, poor appetite, weight loss, receurrent thoughts of death and passive suicidal ideations. " I have always felt that way. I guess I didn't believe I would do. I never had the strength to do it. " He states he took half a bottle of 200 pills [employer] brand sleep aid, ina n attempt to end his life. He reports one previous suicide attempt during his teenage years. He denies any previous psychiatric history. He reports a family history of suicide completion by his maternal aunt at the age of 72. He admits to extensive substance abuse.  He continues to endorse passive suicidal ideations at this time.  HPI:  35 year old M with PMH of marijuana use and tobacco use brought to ED by EMS after intentional overdose with unknown quantity of Benadryl, and admitted to ICU requiring intubation and mechanical ventilation for acute respiratory failure due to inability to protect his airway.  He also had 2 episodes of seizure enroute to ED per EMS report.  UDS positive for amphetamine, benzo and THC.  He was extubated on 02/11/2021, and transferred to hospitalist service on 02/12/2021  Past Psychiatric  History: No previous psychiatric history. He reports substance use, most recently cocaine last used 1 week ago. He denies any legal charges at this time. He reports one previous suicide attempt although he is unable to recall what he did.   Risk to Self:   Yes Risk to Others:   Yes Prior Inpatient Therapy:   no Prior Outpatient Therapy:   No  Past Medical History: History reviewed. No pertinent past medical history.  Past Surgical History:  Procedure Laterality Date  . HERNIA REPAIR    . ORIF TIBIA FRACTURE     Family History: History reviewed. No pertinent family history. Family Psychiatric  History: Maternal aunt completed suicide. Social History:  Social History   Substance and Sexual Activity  Alcohol Use Yes   Comment: occ     Social History   Substance and Sexual Activity  Drug Use No    Social History   Socioeconomic History  . Marital status: Single    Spouse name: Not on file  . Number of children: Not on file  . Years of education: Not on file  . Highest education level: Not on file  Occupational History  . Not on file  Tobacco Use  . Smoking status: Current Every Day Smoker    Packs/day: 5.00    Types: Cigarettes  . Smokeless tobacco: Never Used  Substance and Sexual Activity  . Alcohol use: Yes    Comment: occ  . Drug use: No  . Sexual activity: Yes  Other Topics Concern  .  Not on file  Social History Narrative  . Not on file   Social Determinants of Health   Financial Resource Strain: Not on file  Food Insecurity: Not on file  Transportation Needs: Not on file  Physical Activity: Not on file  Stress: Not on file  Social Connections: Not on file   Additional Social History:    Allergies:   Allergies  Allergen Reactions  . Vicodin Hp [Hydrocodone-Acetaminophen] Itching    Per 11/06/13 notes in computer from [facility]:  Results for orders placed or performed during the hospital encounter of 02/10/21 (from the past 48 hour(s))   SARS CORONAVIRUS 2 (TAT 6-24 HRS) Nasopharyngeal Nasopharyngeal Swab     Status: None   Collection Time: 02/10/21  4:28 PM   Specimen: Nasopharyngeal Swab  Result Value Ref Range   SARS Coronavirus 2 NEGATIVE NEGATIVE    Comment: (NOTE) SARS-CoV-2 target nucleic acids are NOT DETECTED.  The SARS-CoV-2 RNA is generally detectable in upper and lower respiratory specimens during the acute phase of infection. Negative results do not preclude SARS-CoV-2 infection, do not rule out co-infections with other pathogens, and should not be used as the sole basis for treatment or other patient management decisions. Negative results must be combined with clinical observations, patient history, and epidemiological information. The expected result is Negative.  Fact Sheet for Patients: HairSlick.no  Fact Sheet for Healthcare Providers: quierodirigir.com  This test is not yet approved or cleared by the [place] FDA and  has been authorized for detection and/or diagnosis of SARS-CoV-2 by FDA under an Emergency Use Authorization (EUA). This EUA will remain  in effect (meaning this test can be used) for the duration of the COVID-19 declaration under Se ction 564(b)(1) of the Act, 21 U.S.C. section 360bbb-3(b)(1), unless the authorization is terminated or revoked sooner.  Performed at [facility], [address], [place], [place] [address]   Comprehensive metabolic panel     Status: Abnormal   Collection Time: 02/10/21  4:29 PM  Result Value Ref Range   Sodium 138 135 - 145 mmol/L   Potassium 3.8 3.5 - 5.1 mmol/L   Chloride 108 98 - 111 mmol/L   CO2 20 (L) 22 - 32 mmol/L   Glucose, Bld 62 (L) 70 - 99 mg/dL    Comment: Glucose reference range applies only to samples taken after fasting for at least 8 hours.   BUN 19 6 - 20 mg/dL   Creatinine, Ser 6.04 0.61 - 1.24 mg/dL   Calcium 8.1 (L) 8.9 - 10.3 mg/dL   Total Protein 6.4 (L)  6.5 - 8.1 g/dL   Albumin 3.9 3.5 - 5.0 g/dL   AST 16 15 - 41 U/L   ALT 9 0 - 44 U/L   Alkaline Phosphatase 50 38 - 126 U/L   Total Bilirubin 0.4 0.3 - 1.2 mg/dL   GFR, Estimated >54 >09 mL/min    Comment: (NOTE) Calculated using the CKD-EPI Creatinine Equation (2021)    Anion gap 10 5 - 15    Comment: Performed at [facility], [address], [place], [place] [address]  Troponin I (High Sensitivity)     Status: None   Collection Time: 02/10/21  4:29 PM  Result Value Ref Range   Troponin I (High Sensitivity) <2 <18 ng/L    Comment: (NOTE) Elevated high sensitivity troponin I (hsTnI) values and significant  changes across serial measurements may suggest ACS but many other  chronic  and acute conditions are known to elevate hsTnI results.  Refer to the "Links" section for chest pain algorithms and additional  guidance. Performed at [facility], [address] [place], [place] [address]   Lactic acid, plasma     Status: Abnormal   Collection Time: 02/10/21  4:29 PM  Result Value Ref Range   Lactic Acid, Venous 3.0 (HH) 0.5 - 1.9 mmol/L    Comment: CRITICAL RESULT CALLED TO, READ BACK BY AND VERIFIED WITH: HAMILTON,L. RN  ON 04.12.2022 BY COHEN,K Performed at [facility], [address], [place], [place] [address]   CBC with Differential     Status: None   Collection Time: 02/10/21  4:29 PM  Result Value Ref Range   WBC 7.4 4.0 - 10.5 K/uL   RBC 4.52 4.22 - 5.81 MIL/uL   Hemoglobin 14.1 13.0 - 17.0 g/dL   HCT 09.8 11.9 - 14.7 %   MCV 89.8 80.0 - 100.0 fL   MCH 31.2 26.0 - 34.0 pg   MCHC 34.7 30.0 - 36.0 g/dL   RDW 82.9 56.2 - 13.0 %   Platelets 238 150 - 400 K/uL   nRBC 0.0 0.0 - 0.2 %   Neutrophils Relative % 69 %   Neutro Abs 5.0 1.7 - 7.7 K/uL   Lymphocytes Relative 20 %   Lymphs Abs 1.5 0.7 - 4.0 K/uL   Monocytes Relative 10 %   Monocytes Absolute 0.8 0.1 - 1.0 K/uL   Eosinophils Relative 1 %    Eosinophils Absolute 0.1 0.0 - 0.5 K/uL   Basophils Relative 0 %   Basophils Absolute 0.0 0.0 - 0.1 K/uL   Immature Granulocytes 0 %   Abs Immature Granulocytes 0.03 0.00 - 0.07 K/uL    Comment: Performed at [facility], [address], [place], [place] [address]  Acetaminophen level     Status: Abnormal   Collection Time: 02/10/21  4:35 PM  Result Value Ref Range   Acetaminophen (Tylenol), Serum <10 (L) 10 - 30 ug/mL    Comment: (NOTE) Therapeutic concentrations vary significantly. A range of 10-30 ug/mL  may be an effective concentration for many patients. However, some  are best treated at concentrations outside of this range. Acetaminophen concentrations >150 ug/mL at 4 hours after ingestion  and >50 ug/mL at 12 hours after ingestion are often associated with  toxic reactions.  Performed at [facility], [address] [place], [place] [address]   Ethanol     Status: None   Collection Time: 02/10/21  4:35 PM  Result Value Ref Range   Alcohol, Ethyl (B) <10 <10 mg/dL    Comment: (NOTE) Lowest detectable limit for serum alcohol is 10 mg/dL.  For medical purposes only. Performed at [facility], [address] [place], [place] [address]   Salicylate level     Status: Abnormal   Collection Time: 02/10/21  4:35 PM  Result Value Ref Range   Salicylate Lvl <7.0 (L) 7.0 - 30.0 mg/dL    Comment: Performed at [facility], [address] [place], [place] [address]  Urine rapid drug screen (hosp performed)     Status: Abnormal   Collection Time: 02/10/21  4:35 PM  Result Value Ref Range   Opiates NONE DETECTED NONE DETECTED   Cocaine NONE DETECTED NONE DETECTED   Benzodiazepines POSITIVE (A) NONE DETECTED   Amphetamines POSITIVE (A) NONE DETECTED   Tetrahydrocannabinol POSITIVE (A) NONE DETECTED  Barbiturates NONE DETECTED NONE DETECTED    Comment: (NOTE) DRUG SCREEN FOR MEDICAL PURPOSES ONLY.   IF CONFIRMATION IS NEEDED FOR ANY PURPOSE, NOTIFY LAB WITHIN 5 DAYS.  LOWEST DETECTABLE LIMITS FOR URINE DRUG SCREEN Drug Class                     Cutoff (ng/mL) Amphetamine and metabolites    1000 Barbiturate and metabolites    200 Benzodiazepine                 200 Tricyclics and metabolites     300 Opiates and metabolites        300 Cocaine and metabolites        300 THC                            50 Performed at [facility], [address] [place], [place] [address]   Urinalysis, Routine w reflex microscopic Urine, Catheterized     Status: Abnormal   Collection Time: 02/10/21  4:35 PM  Result Value Ref Range   Color, Urine YELLOW YELLOW   APPearance CLEAR CLEAR   Specific Gravity, Urine 1.028 1.005 - 1.030   pH 5.0 5.0 - 8.0   Glucose, UA NEGATIVE NEGATIVE mg/dL   Hgb urine dipstick NEGATIVE NEGATIVE   Bilirubin Urine NEGATIVE NEGATIVE   Ketones, ur NEGATIVE NEGATIVE mg/dL   Protein, ur 30 (A) NEGATIVE mg/dL   Nitrite NEGATIVE NEGATIVE   Leukocytes,Ua NEGATIVE NEGATIVE   RBC / HPF 0-5 0 - 5 RBC/hpf   WBC, UA 0-5 0 - 5 WBC/hpf   Bacteria, UA NONE SEEN NONE SEEN   Mucus PRESENT    Hyaline Casts, UA PRESENT     Comment: Performed at [facility], [address] [place], [place] [address]  CBG monitoring, ED     Status: Abnormal   Collection Time: 02/10/21  4:49 PM  Result Value Ref Range   Glucose-Capillary 50 (L) 70 - 99 mg/dL    Comment: Glucose reference range applies only to samples taken after fasting for at least 8 hours.  CBG monitoring, ED     Status: Abnormal   Collection Time: 02/10/21  5:23 PM  Result Value Ref Range   Glucose-Capillary 127 (H) 70 - 99 mg/dL    Comment: Glucose reference range applies only to samples taken after fasting for at least 8 hours.  Blood gas, arterial     Status: Abnormal   Collection Time: 02/10/21  6:00 PM  Result Value Ref Range   FIO2 100.00    Delivery systems VENTILATOR    Mode  PRESSURE REGULATED VOLUME CONTROL    VT 560 mL   LHR 20 resp/min   Peep/cpap 5.0 cm H20   pH, Arterial 7.308 (L) 7.350 - 7.450   pCO2 arterial 23.2 (L) 32.0 - 48.0 mmHg   pO2, Arterial 55.3 (L) 83.0 - 108.0 mmHg   Bicarbonate 11.3 (L) 20.0 - 28.0 mmol/L   Acid-base deficit 13.7 (H) 0.0 - 2.0 mmol/L   O2 Saturation 85.0 %   Patient temperature 98.6    Collection site RIGHT BRACHIAL    Drawn by [id number]    Sample type ARTERIAL     Comment: Performed at [facility], [address] [place], [place] [address]  Lactic acid, plasma     Status: None   Collection Time: 02/10/21  6:29 PM  Result Value  Ref Range   Lactic Acid, Venous 1.1 0.5 - 1.9 mmol/L    Comment: Performed at [facility], [address] [place], [place] [address]  Troponin I (High Sensitivity)     Status: None   Collection Time: 02/10/21  6:29 PM  Result Value Ref Range   Troponin I (High Sensitivity) 2 <18 ng/L    Comment: (NOTE) Elevated high sensitivity troponin I (hsTnI) values and significant  changes across serial measurements may suggest ACS but many other  chronic and acute conditions are known to elevate hsTnI results.  Refer to the "Links" section for chest pain algorithms and additional  guidance. Performed at [facility], [address] [place], [place] [address]   CBG monitoring, ED     Status: Abnormal   Collection Time: 02/10/21  7:25 PM  Result Value Ref Range   Glucose-Capillary 53 (L) 70 - 99 mg/dL    Comment: Glucose reference range applies only to samples taken after fasting for at least 8 hours.  HIV Antibody (routine testing w rflx)     Status: None   Collection Time: 02/10/21  8:10 PM  Result Value Ref Range   HIV Screen 4th Generation wRfx Non Reactive Non Reactive    Comment: Performed at [facility], [address], [place], [place] [address]  MRSA PCR Screening     Status: None   Collection Time: 02/11/21 12:13 AM    Specimen: Nasal Mucosa; Nasopharyngeal  Result Value Ref Range   MRSA by PCR NEGATIVE NEGATIVE    Comment:        The GeneXpert MRSA Assay (FDA approved for NASAL specimens only), is one component of a comprehensive MRSA colonization surveillance program. It is not intended to diagnose MRSA infection nor to guide or monitor treatment for MRSA infections. Performed at [facility], [address], [place], [place] [address]   CBC     Status: Abnormal   Collection Time: 02/11/21  3:02 AM  Result Value Ref Range   WBC 8.3 4.0 - 10.5 K/uL   RBC 4.00 (L) 4.22 - 5.81 MIL/uL   Hemoglobin 12.4 (L) 13.0 - 17.0 g/dL   HCT 96.2 (L) 95.2 - 84.1 %   MCV 92.3 80.0 - 100.0 fL   MCH 31.0 26.0 - 34.0 pg   MCHC 33.6 30.0 - 36.0 g/dL   RDW 32.4 40.1 - 02.7 %   Platelets 183 150 - 400 K/uL   nRBC 0.0 0.0 - 0.2 %    Comment: Performed at [facility], [address], [place], [place] [address]  Basic metabolic panel     Status: Abnormal   Collection Time: 02/11/21  3:02 AM  Result Value Ref Range   Sodium 137 135 - 145 mmol/L   Potassium 3.3 (L) 3.5 - 5.1 mmol/L   Chloride 110 98 - 111 mmol/L   CO2 19 (L) 22 - 32 mmol/L   Glucose, Bld 76 70 - 99 mg/dL    Comment: Glucose reference range applies only to samples taken after fasting for at least 8 hours.   BUN 14 6 - 20 mg/dL   Creatinine, Ser 4.40 0.61 - 1.24 mg/dL   Calcium 8.1 (L) 8.9 - 10.3 mg/dL   GFR, Estimated >34 >74 mL/min    Comment: (NOTE) Calculated using the CKD-EPI Creatinine Equation (2021)    Anion gap 8 5 - 15    Comment: Performed at [facility], [address] [place], [place] [address]  Magnesium     Status: None   Collection Time: 02/11/21  3:02 AM  Result Value Ref Range   Magnesium 2.0 1.7 - 2.4 mg/dL    Comment: Performed at [facility], [address] [place], [place] [address]  Phosphorus     Status: None   Collection Time: 02/11/21   3:02 AM  Result Value Ref Range   Phosphorus 2.6 2.5 - 4.6 mg/dL    Comment: Performed at [facility], [address], [place], [place] [address]  CK     Status: None   Collection Time: 02/11/21  8:49 AM  Result Value Ref Range   Total CK 50 49 - 397 U/L    Comment: Performed at [facility], [address], [place], [place] [address]  Glucose, capillary     Status: None   Collection Time: 02/11/21 11:55 AM  Result Value Ref Range   Glucose-Capillary 95 70 - 99 mg/dL    Comment: Glucose reference range applies only to samples taken after fasting for at least 8 hours.   Comment 1 Notify RN    Comment 2 Document in Chart   Glucose, capillary     Status: None   Collection Time: 02/11/21  6:11 PM  Result Value Ref Range   Glucose-Capillary 88 70 - 99 mg/dL    Comment: Glucose reference range applies only to samples taken after fasting for at least 8 hours.   Comment 1 Notify RN    Comment 2 Document in Chart   Glucose, capillary     Status: None   Collection Time: 02/12/21 12:02 AM  Result Value Ref Range   Glucose-Capillary 91 70 - 99 mg/dL    Comment: Glucose reference range applies only to samples taken after fasting for at least 8 hours.   Comment 1 Notify RN    Comment 2 Document in Chart   Basic metabolic panel     Status: Abnormal   Collection Time: 02/12/21  3:03 AM  Result Value Ref Range   Sodium 134 (L) 135 - 145 mmol/L   Potassium 4.1 3.5 - 5.1 mmol/L    Comment: DELTA CHECK NOTED   Chloride 106 98 - 111 mmol/L   CO2 21 (L) 22 - 32 mmol/L   Glucose, Bld 86 70 - 99 mg/dL    Comment: Glucose reference range applies only to samples taken after fasting for at least 8 hours.   BUN 6 6 - 20 mg/dL   Creatinine, Ser 2.95 0.61 - 1.24 mg/dL   Calcium 8.2 (L) 8.9 - 10.3 mg/dL   GFR, Estimated >62 >13 mL/min    Comment: (NOTE) Calculated using the CKD-EPI Creatinine Equation (2021)    Anion gap 7 5 - 15    Comment: Performed at [facility], [address] [place], [place] [address]  Magnesium     Status: None   Collection Time: 02/12/21  3:03 AM  Result Value Ref Range   Magnesium 1.9 1.7 - 2.4 mg/dL    Comment: Performed at [facility], [address], [place], [place] [address]  Glucose, capillary     Status: Abnormal   Collection Time: 02/12/21  6:19 AM  Result Value Ref Range   Glucose-Capillary 112 (H) 70 - 99 mg/dL    Comment: Glucose reference range applies only to samples taken after fasting for at least 8 hours.   Comment 1  Notify RN    Comment 2 Document in Chart     Current Facility-Administered Medications  Medication Dose Route Frequency Provider Last Rate Last Admin  . acetaminophen (TYLENOL) tablet 650 mg  650 mg Oral Q8H PRN Almon Hercules, MD   650 mg at 02/11/21 1832  . Chlorhexidine Gluconate Cloth 2 % PADS 6 each  6 each Topical Daily Almon Hercules, MD   6 each at 02/12/21 0954  . docusate sodium (COLACE) capsule 100 mg  100 mg Oral BID PRN Gonfa, Taye T, MD      . enoxaparin (LOVENOX) injection 40 mg  40 mg Subcutaneous Q24H Candelaria Stagers T, MD   40 mg at 02/11/21 2122  . magnesium sulfate IVPB 2 g 50 mL  2 g Intravenous Once Gonfa, Taye T, MD      . MEDLINE mouth rinse  15 mL Mouth Rinse BID Candelaria Stagers T, MD   15 mL at 02/12/21 0954  . menthol-cetylpyridinium (CEPACOL) lozenge 3 mg  1 lozenge Oral PRN Almon Hercules, MD   3 mg at 02/12/21 0552  . nicotine (NICODERM CQ - dosed in mg/24 hours) patch 14 mg  14 mg Transdermal Daily Candelaria Stagers T, MD   14 mg at 02/12/21 1341  . pantoprazole (PROTONIX) EC tablet 40 mg  40 mg Oral BID Len Childs T, RPH      . polyethylene glycol (MIRALAX / GLYCOLAX) packet 17 g  17 g Oral Daily PRN Gonfa, Taye T, MD      . sodium chloride flush (NS) 0.9 % injection 10 mL  10 mL Intravenous Q12H Candelaria Stagers T, MD   10 mL at 02/12/21 3976    Musculoskeletal: Strength & Muscle Tone: within normal limits Gait & Station:  normal Patient leans: N/A            Psychiatric Specialty Exam:  Presentation  General Appearance: Disheveled  Eye Contact:Minimal  Speech:Clear and Coherent; Normal Rate  Speech Volume:Decreased  Handedness:Right   Mood and Affect  Mood:Depressed; Dysphoric  Affect:Blunt; Depressed   Thought Process  Thought Processes:Coherent; Linear  Descriptions of Associations:Intact  Orientation:Full (Time, Place and Person)  Thought Content:Logical  History of Schizophrenia/Schizoaffective disorder:No data recorded Duration of Psychotic Symptoms:No data recorded Hallucinations:Hallucinations: None  Ideas of Reference:None  Suicidal Thoughts:Suicidal Thoughts: Yes, Passive SI Passive Intent and/or Plan: Without Intent; Without Means to Carry Out; Without Access to Means  Homicidal Thoughts:Homicidal Thoughts: No   Sensorium  Memory:Immediate Fair; Recent Fair; Remote Fair  Judgment:Fair  Insight:Present   Executive Functions  Concentration:Fair  Attention Span:Fair  Recall:Fair  Fund of Knowledge:Fair  Language:Fair   Psychomotor Activity  Psychomotor Activity:Psychomotor Activity: Normal   Assets  Assets:Communication Skills; Desire for Improvement; Financial Resources/Insurance; Physical Health; Talents/Skills; Social Support   Sleep  Sleep:Sleep: Fair   Physical Exam: Physical Exam ROS Blood pressure 116/74, pulse 93, temperature 99.5 F (37.5 C), temperature source Oral, resp. rate (!) 35, height 5\' 9"  (1.753 m), weight 61.4 kg, SpO2 99 %. Body mass index is 19.99 kg/m.  Treatment Plan Summary: Plan Recommend inpatient psychiatric admission once medically stable. Will benefit from medication amangement, crisis stabilization and therapuetic services. Subsqeuntly will need outpatient long term substance abuse treatment.  -Recommend SW facilitate inpatient referral once patient is medically stable.  -He is voluntary at this time,  recommend IVC if he attempts to leave.  -Continue safety sitter at this time for suicide precautions.   -Will start zoloft 25mg  po daily for  depression.  Disposition: Recommend psychiatric Inpatient admission when medically cleared.  Maryagnes Amos, FNP 02/12/2021 4:15 PM

## 2021-02-13 ENCOUNTER — Other Ambulatory Visit: Payer: Self-pay

## 2021-02-13 ENCOUNTER — Inpatient Hospital Stay
Admission: RE | Admit: 2021-02-13 | Discharge: 2021-02-17 | DRG: 885 | Disposition: A | Discharge status: Home / Self Care | Payer: 59 | Source: Intra-hospital | Attending: Behavioral Health | Admitting: Behavioral Health

## 2021-02-13 ENCOUNTER — Encounter: Payer: Self-pay | Admitting: Behavioral Health

## 2021-02-13 DIAGNOSIS — F17210 Nicotine dependence, cigarettes, uncomplicated: Secondary | ICD-10-CM | POA: Diagnosis present

## 2021-02-13 DIAGNOSIS — F1290 Cannabis use, unspecified, uncomplicated: Secondary | ICD-10-CM | POA: Diagnosis present

## 2021-02-13 DIAGNOSIS — F322 Major depressive disorder, single episode, severe without psychotic features: Secondary | ICD-10-CM

## 2021-02-13 DIAGNOSIS — Z885 Allergy status to narcotic agent status: Secondary | ICD-10-CM

## 2021-02-13 DIAGNOSIS — F332 Major depressive disorder, recurrent severe without psychotic features: Principal | ICD-10-CM | POA: Diagnosis present

## 2021-02-13 DIAGNOSIS — Z20822 Contact with and (suspected) exposure to covid-19: Secondary | ICD-10-CM | POA: Diagnosis present

## 2021-02-13 DIAGNOSIS — F1410 Cocaine abuse, uncomplicated: Secondary | ICD-10-CM | POA: Diagnosis present

## 2021-02-13 DIAGNOSIS — Z9151 Personal history of suicidal behavior: Secondary | ICD-10-CM | POA: Diagnosis not present

## 2021-02-13 DIAGNOSIS — Z811 Family history of alcohol abuse and dependence: Secondary | ICD-10-CM | POA: Diagnosis not present

## 2021-02-13 DIAGNOSIS — F17200 Nicotine dependence, unspecified, uncomplicated: Secondary | ICD-10-CM | POA: Diagnosis present

## 2021-02-13 DIAGNOSIS — E559 Vitamin D deficiency, unspecified: Secondary | ICD-10-CM | POA: Diagnosis present

## 2021-02-13 DIAGNOSIS — F329 Major depressive disorder, single episode, unspecified: Secondary | ICD-10-CM | POA: Diagnosis present

## 2021-02-13 DIAGNOSIS — T1491XA Suicide attempt, initial encounter: Secondary | ICD-10-CM

## 2021-02-13 DIAGNOSIS — F1510 Other stimulant abuse, uncomplicated: Secondary | ICD-10-CM | POA: Diagnosis present

## 2021-02-13 LAB — GLUCOSE, CAPILLARY
Glucose-Capillary: 84 mg/dL (ref 70–99)
Glucose-Capillary: 94 mg/dL (ref 70–99)
Glucose-Capillary: 79 mg/dL (ref 70–99)

## 2021-02-13 LAB — RESP PANEL BY RT-PCR (FLU A&B, COVID) ARPGX2
Influenza A by PCR: NEGATIVE
Influenza B by PCR: NEGATIVE
SARS Coronavirus 2 by RT PCR: NEGATIVE

## 2021-02-13 LAB — RENAL FUNCTION PANEL
Albumin: 3.5 g/dL (ref 3.5–5.0)
Anion gap: 7 (ref 5–15)
BUN: 10 mg/dL (ref 6–20)
CO2: 24 mmol/L (ref 22–32)
Calcium: 8.3 mg/dL — ABNORMAL LOW (ref 8.9–10.3)
Chloride: 107 mmol/L (ref 98–111)
Creatinine, Ser: 0.97 mg/dL (ref 0.61–1.24)
GFR, Estimated: >60 mL/min (ref >60)
Glucose, Bld: 88 mg/dL (ref 70–99)
Phosphorus: 2.8 mg/dL (ref 2.5–4.6)
Potassium: 3.8 mmol/L (ref 3.5–5.1)
Sodium: 138 mmol/L (ref 135–145)

## 2021-02-13 LAB — VITAMIN D 25 HYDROXY (VIT D DEFICIENCY, FRACTURES): Vit D, 25-Hydroxy: 14.98 ng/mL — ABNORMAL LOW (ref 30–100)

## 2021-02-13 LAB — MAGNESIUM: Magnesium: 2.1 mg/dL (ref 1.7–2.4)

## 2021-02-13 LAB — TSH: TSH: 0.585 u[IU]/mL (ref 0.350–4.500)

## 2021-02-13 LAB — CBC
HCT: 41.1 % (ref 39.0–52.0)
Hemoglobin: 14 g/dL (ref 13.0–17.0)
MCH: 30.9 pg (ref 26.0–34.0)
MCHC: 34.1 g/dL (ref 30.0–36.0)
MCV: 90.7 fL (ref 80.0–100.0)
Platelets: 193 10*3/uL (ref 150–400)
RBC: 4.53 MIL/uL (ref 4.22–5.81)
RDW: 12.5 % (ref 11.5–15.5)
WBC: 13.9 10*3/uL — ABNORMAL HIGH (ref 4.0–10.5)
nRBC: 0 % (ref 0.0–0.2)

## 2021-02-13 MED ORDER — DOCUSATE SODIUM 100 MG PO CAPS
100.0000 mg | ORAL_CAPSULE | Freq: Two times a day (BID) | ORAL | Status: DC | PRN
Start: 2021-02-13 — End: 2021-02-17

## 2021-02-13 MED ORDER — NICOTINE 14 MG/24HR TD PT24
14.0000 mg | MEDICATED_PATCH | Freq: Every day | TRANSDERMAL | Status: DC
  Administered 2021-02-14 – 2021-02-17 (×4): 14 mg via TRANSDERMAL
  Filled 2021-02-13 (×4): qty 1

## 2021-02-13 MED ORDER — TRAZODONE HCL 50 MG PO TABS
50.0000 mg | ORAL_TABLET | Freq: Every evening | ORAL | Status: DC | PRN
  Administered 2021-02-13 – 2021-02-16 (×2): 50 mg via ORAL
  Filled 2021-02-13 (×2): qty 1

## 2021-02-13 MED ORDER — POLYETHYLENE GLYCOL 3350 17 G PO PACK
17.0000 g | PACK | Freq: Every day | ORAL | Status: DC | PRN
Start: 2021-02-13 — End: 2021-02-17

## 2021-02-13 MED ORDER — MAGNESIUM HYDROXIDE 400 MG/5ML PO SUSP: 30.0000 mL | Freq: Every day | ORAL | Status: DC | PRN

## 2021-02-13 MED ORDER — PANTOPRAZOLE SODIUM 40 MG PO TBEC
40.0000 mg | DELAYED_RELEASE_TABLET | Freq: Every day | ORAL | Status: DC
Start: 2021-02-13 — End: 2021-02-17

## 2021-02-13 MED ORDER — PANTOPRAZOLE SODIUM 40 MG PO TBEC
40.0000 mg | DELAYED_RELEASE_TABLET | Freq: Two times a day (BID) | ORAL | Status: DC
  Administered 2021-02-13 – 2021-02-17 (×8): 40 mg via ORAL
  Filled 2021-02-13 (×8): qty 1

## 2021-02-13 MED ORDER — ENSURE ENLIVE PO LIQD: 237.0000 mL | Freq: Two times a day (BID) | ORAL | 12 refills | Status: DC

## 2021-02-13 MED ORDER — POTASSIUM CHLORIDE CRYS ER 20 MEQ PO TBCR
40.0000 meq | EXTENDED_RELEASE_TABLET | Freq: Once | ORAL | Status: AC
  Administered 2021-02-13: 40 meq via ORAL
  Filled 2021-02-13: qty 2

## 2021-02-13 MED ORDER — MENTHOL 3 MG MT LOZG
1.0000 | LOZENGE | OROMUCOSAL | Status: DC | PRN
  Filled 2021-02-13: qty 9

## 2021-02-13 MED ORDER — ACETAMINOPHEN 325 MG PO TABS: 650.0000 mg | ORAL_TABLET | Freq: Three times a day (TID) | ORAL | Status: DC | PRN

## 2021-02-13 MED ORDER — NICOTINE 14 MG/24HR TD PT24: 14.0000 mg | MEDICATED_PATCH | Freq: Every day | TRANSDERMAL | 0 refills | Status: DC

## 2021-02-13 MED ORDER — SERTRALINE HCL 25 MG PO TABS
25.0000 mg | ORAL_TABLET | Freq: Every day | ORAL | Status: DC
  Administered 2021-02-14: 25 mg via ORAL
  Filled 2021-02-13: qty 1

## 2021-02-13 MED ORDER — VITAMIN D (ERGOCALCIFEROL) 1.25 MG (50000 UNIT) PO CAPS: 50000.0000 [IU] | ORAL_CAPSULE | ORAL | 0 refills | Status: DC

## 2021-02-13 MED ORDER — SERTRALINE HCL 25 MG PO TABS: 25.0000 mg | ORAL_TABLET | Freq: Every day | ORAL | Status: DC

## 2021-02-13 MED ORDER — LORAZEPAM 1 MG PO TABS
1.0000 mg | ORAL_TABLET | Freq: Once | ORAL | Status: AC
  Administered 2021-02-13: 1 mg via ORAL
  Filled 2021-02-13: qty 1

## 2021-02-13 MED ORDER — ALUM & MAG HYDROXIDE-SIMETH 200-200-20 MG/5ML PO SUSP
30.0000 mL | ORAL | Status: DC | PRN
Start: 2021-02-13 — End: 2021-02-17

## 2021-02-13 MED ORDER — ACETAMINOPHEN 325 MG PO TABS
650.0000 mg | ORAL_TABLET | Freq: Four times a day (QID) | ORAL | Status: DC | PRN
Start: 2021-02-13 — End: 2021-02-17

## 2021-02-13 MED ORDER — POLYETHYLENE GLYCOL 3350 17 G PO PACK: 17.0000 g | PACK | Freq: Every day | ORAL | 0 refills | Status: DC | PRN

## 2021-02-13 MED ORDER — MAGNESIUM SULFATE IN D5W 1-5 GM/100ML-% IV SOLN
1.0000 g | Freq: Once | INTRAVENOUS | Status: AC
  Administered 2021-02-13: 1 g via INTRAVENOUS
  Filled 2021-02-13: qty 100

## 2021-02-13 NOTE — Progress Notes (Signed)
Patient left ambulatory with sheriffs office. Patient alert and oriented. PIV's removed. Belonging sent with sheriffs office and mother. No other needs identified.

## 2021-02-13 NOTE — Progress Notes (Signed)
Report given to Henriette Combs at [facility]. Awaiting arrival of sheriff to floor.

## 2021-02-13 NOTE — Plan of Care (Signed)
  Problem: Education: Goal: Knowledge of [facility] Education information/materials will improve Outcome: Progressing Goal: Emotional status will improve Outcome: Progressing Goal: Mental status will improve Outcome: Progressing Goal: Verbalization of understanding the information provided will improve Outcome: Progressing   Problem: Activity: Goal: Interest or engagement in activities will improve Outcome: Progressing Goal: Sleeping patterns will improve Outcome: Progressing   Problem: Coping: Goal: Ability to verbalize frustrations and anger appropriately will improve Outcome: Progressing Goal: Ability to demonstrate self-control will improve Outcome: Progressing   Problem: Health Behavior/Discharge Planning: Goal: Identification of resources available to assist in meeting health care needs will improve Outcome: Progressing Goal: Compliance with treatment plan for underlying cause of condition will improve Outcome: Progressing   Problem: Physical Regulation: Goal: Ability to maintain clinical measurements within normal limits will improve Outcome: Progressing   Problem: Safety: Goal: Periods of time without injury will increase Outcome: Progressing   Problem: Education: Goal: Ability to make informed decisions regarding treatment will improve Outcome: Progressing   Problem: Coping: Goal: Coping ability will improve Outcome: Progressing   Problem: Health Behavior/Discharge Planning: Goal: Identification of resources available to assist in meeting health care needs will improve Outcome: Progressing   Problem: Medication: Goal: Compliance with prescribed medication regimen will improve Outcome: Progressing   Problem: Self-Concept: Goal: Ability to disclose and discuss suicidal ideas will improve Outcome: Progressing Goal: Will verbalize positive feelings about self Outcome: Progressing   

## 2021-02-13 NOTE — Discharge Summary (Signed)
Physician Discharge Summary  Kevin Stout XBJ:[id number] DOB: 1986/05/19 DOA: 02/10/2021  PCP: Patient, No Pcp Per (Inactive)  Admit date: 02/10/2021 Discharge date: 02/13/2021  Admitted From: Home Disposition: Inpatient psychiatric hospital  Recommendations for Outpatient Follow-up:  1. Follow ups as below. 2. Please obtain CBC/BMP/Mag at follow up 3. Please follow up on the following pending results: None   Discharge Condition: Stable CODE STATUS: Full code   Hospital Course: 35 year old M with PMH of marijuana use and tobacco use brought to ED by EMS after intentional overdose with unknown quantity of Benadryl, and admitted to ICU requiring intubation and mechanical ventilation for acute respiratory failure due to inability to protect his airway.  He also had 2 episodes of seizure enroute to ED per EMS report.  UDS positive for amphetamine, benzo and THC.  He was extubated on 02/11/2021, and transferred to hospitalist service on 02/12/2021.  Patient remained stable on room air.  Evaluated by psychiatry and started on Zoloft.  Psychiatry recommended inpatient psychiatric hospitalization.  Discharge Diagnoses:  Intentional drug overdose/suicidal attempt-admits to taking unknown quantity of Benadryl with the intent to end his life in the setting of stressful home dynamics with "my boys mother".  Denies previous history of depression or mental illness.  UDS positive for amphetamine, benzo and THC although he denies amphetamine use.  TSH within normal.  Vitamin D 14.98. -Medically cleared -Psychiatry started patient on Zoloft and recommended inpatient psychiatric hospital admission -P.o. vitamin D 50,000 international unit weekly for 8 weeks   Acute toxic encephalopathy: Likely due to the above.  Resolved. Seizure-reportedly had 2 seizure episodes in route to ED per EMS report.  No history of seizure. -Reorientation and delirium precautions -Doubt the need for AED as his seizure  is likely from overdose.  Acute Respiratory Failure due to inability to protect airway after Benadryl overdose: Resolved.  Acute Metabolic Acidosis: Resolved.  Hyponatremia: Resolved.  Hypokalemia: Resolved.  Hypoglycemia: In the setting of the above.  Resolved.  Tobacco use disorder: Reports smoking about half a pack a day.  -Encouraged cessation -Nicotine patch  Polysubstance use: UDS positive for amphetamine, benzo and THC.  He probably received benzos for seizure in route to ED.  He denies amphetamine use but not sure if he took DayQuil as well -Encouraged to avoid marijuana  Prolonged QTc-500>> 497. -Avoid QT prolonging drugs -Optimize K and Mg. -Continue telemetry monitoring  Vitamin D insufficiency -P.o. vitamin D 50,000 international unit weekly for 8 weeks  Debility/physical deconditioning-improved.  No need identified by therapy.   Body mass index is 19.48 kg/m.  -Ensure Enlive          Discharge Exam: Vitals:   02/13/21 0455 02/13/21 1357  BP: 126/81 122/76  Pulse: 77 83  Resp: 18 (!) 21  Temp: 98.2 F (36.8 C)   SpO2: 98% 96%    GENERAL: No apparent distress.  Nontoxic. HEENT: MMM.  Vision and hearing grossly intact.  NECK: Supple.  No apparent JVD.  RESP: On RA.  No IWOB.  Fair aeration bilaterally. CVS:  RRR. Heart sounds normal.  ABD/GI/GU: Bowel sounds present. Soft. Non tender.  MSK/EXT:  Moves extremities. No apparent deformity. No edema.  SKIN: no apparent skin lesion or wound NEURO: Awake, alert and oriented appropriately.  No apparent focal neuro deficit. PSYCH: Calm. Normal affect.  Discharge Instructions  Discharge Instructions    Diet general   Complete by: As directed      Allergies as of 02/13/2021  Reactions   Vicodin Hp [hydrocodone-acetaminophen] Itching   Per 11/06/13 notes in computer from [facility]      Medication List    TAKE these medications   nicotine 14 mg/24hr patch Commonly known as:  NICODERM CQ - dosed in mg/24 hours Place 1 patch (14 mg total) onto the skin daily. Start taking on: February 14, 2021   pantoprazole 40 MG tablet Commonly known as: PROTONIX Take 1 tablet (40 mg total) by mouth daily.   polyethylene glycol 17 g packet Commonly known as: MIRALAX / GLYCOLAX Take 17 g by mouth daily as needed for moderate constipation.   sertraline 25 MG tablet Commonly known as: ZOLOFT Take 1 tablet (25 mg total) by mouth daily. Start taking on: February 14, 2021       Consultations:  Pulmonology  Psychiatry  Procedures/Studies:  Intubation and mechanical ventilation from 4/12-4/13   CT Head Wo Contrast  Result Date: 02/10/2021 CLINICAL DATA:  Altered level of consciousness, Benadryl overdose EXAM: CT HEAD WITHOUT CONTRAST TECHNIQUE: Contiguous axial images were obtained from the base of the skull through the vertex without intravenous contrast. COMPARISON:  11/07/2013 FINDINGS: Brain: No acute infarct or hemorrhage. Encephalomalacia within the right basal ganglia could be related to prior infarct or hemorrhage. Lateral ventricles and remaining midline structures are unremarkable. There are no acute extra-axial fluid collections. No mass effect. Vascular: No hyperdense vessel or unexpected calcification. Skull: Normal. Negative for fracture or focal lesion. Sinuses/Orbits: Mucosal thickening within the right maxillary sinus, with superimposed gas fluid level. Mild mucosal thickening of the ethmoid air cells. Other: None. IMPRESSION: 1. No acute intracranial process. 2. 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 3. Right ethmoid and maxillary sinus disease. Electronically Signed   By: Sharlet Salina M.D.   On: 02/10/2021 17:59   DG Chest Port 1 View  Result Date: 02/10/2021 CLINICAL DATA:  Intubation. EXAM: PORTABLE CHEST 1 VIEW COMPARISON:  December 30, 2018. FINDINGS: The heart size and mediastinal contours are within  normal limits. Endotracheal tube appears to be in grossly good position. Both lungs are clear. The visualized skeletal structures are unremarkable. IMPRESSION: Endotracheal tube in good position. No acute cardiopulmonary abnormality seen. Electronically Signed   By: Lupita Raider M.D.   On: 02/10/2021 17:39   DG Abd Portable 1V  Result Date: 02/10/2021 CLINICAL DATA:  NG tube placement EXAM: PORTABLE ABDOMEN - 1 VIEW COMPARISON:  None. FINDINGS: Esophageal tube tip and side port overlie the mid stomach. Upper gas pattern is unremarkable IMPRESSION: Esophageal tube tip overlies the mid stomach. Electronically Signed   By: Jasmine Pang M.D.   On: 02/10/2021 19:37        The results of significant diagnostics from this hospitalization (including imaging, microbiology, ancillary and laboratory) are listed below for reference.     Microbiology: Recent Results (from the past 240 hour(s))  SARS CORONAVIRUS 2 (TAT 6-24 HRS) Nasopharyngeal Nasopharyngeal Swab     Status: None   Collection Time: 02/10/21  4:28 PM   Specimen: Nasopharyngeal Swab  Result Value Ref Range Status   SARS Coronavirus 2 NEGATIVE NEGATIVE Final    Comment: (NOTE) SARS-CoV-2 target nucleic acids are NOT DETECTED.  The SARS-CoV-2 RNA is generally detectable in upper and lower respiratory specimens during the acute phase of infection. Negative results do not preclude SARS-CoV-2 infection, do not rule out co-infections with other pathogens, and should not be used as the sole basis for treatment or other patient management decisions.  Negative results must be combined with clinical observations, patient history, and epidemiological information. The expected result is Negative.  Fact Sheet for Patients: HairSlick.nohttps://www.fda.gov/media/138098/download  Fact Sheet for Healthcare Providers: quierodirigir.comhttps://www.fda.gov/media/138095/download  This test is not yet approved or cleared by the [place] FDA and  has been authorized  for detection and/or diagnosis of SARS-CoV-2 by FDA under an Emergency Use Authorization (EUA). This EUA will remain  in effect (meaning this test can be used) for the duration of the COVID-19 declaration under Se ction 564(b)(1) of the Act, 21 U.S.C. section 360bbb-3(b)(1), unless the authorization is terminated or revoked sooner.  Performed at [facility], [address] [place], [place] [address]   MRSA PCR Screening     Status: None   Collection Time: 02/11/21 12:13 AM   Specimen: Nasal Mucosa; Nasopharyngeal  Result Value Ref Range Status   MRSA by PCR NEGATIVE NEGATIVE Final    Comment:        The GeneXpert MRSA Assay (FDA approved for NASAL specimens only), is one component of a comprehensive MRSA colonization surveillance program. It is not intended to diagnose MRSA infection nor to guide or monitor treatment for MRSA infections. Performed at [facility], [address] [place], [place] [address]      Labs:  CBC: Recent Labs  Lab 02/10/21 1629 02/11/21 0302 02/13/21 0340  WBC 7.4 8.3 13.9*  NEUTROABS 5.0  --   --   HGB 14.1 12.4* 14.0  HCT 40.6 36.9* 41.1  MCV 89.8 92.3 90.7  PLT 238 183 193   BMP &GFR Recent Labs  Lab 02/10/21 1629 02/11/21 0302 02/12/21 0303 02/13/21 0340  NA 138 137 134* 138  K 3.8 3.3* 4.1 3.8  CL 108 110 106 107  CO2 20* 19* 21* 24  GLUCOSE 62* 76 86 88  BUN 19 14 6 10   CREATININE 0.92 0.91 0.78 0.97  CALCIUM 8.1* 8.1* 8.2* 8.3*  MG  --  2.0 1.9 2.1  PHOS  --  2.6  --  2.8   Estimated Creatinine Clearance: 91.6 mL/min (by C-G formula based on SCr of 0.97 mg/dL). Liver & Pancreas: Recent Labs  Lab 02/10/21 1629 02/13/21 0340  AST 16  --   ALT 9  --   ALKPHOS 50  --   BILITOT 0.4  --   PROT 6.4*  --   ALBUMIN 3.9 3.5   No results for input(s): LIPASE, AMYLASE in the last 168 hours. No results for input(s): AMMONIA in the last 168 hours. Diabetic: No results for input(s): HGBA1C in  the last 72 hours. Recent Labs  Lab 02/12/21 1206 02/12/21 1903 02/12/21 2228 02/13/21 0501 02/13/21 1143  GLUCAP 84 114* 127* 94 79   Cardiac Enzymes: Recent Labs  Lab 02/11/21 0849  CKTOTAL 50   No results for input(s): PROBNP in the last 8760 hours. Coagulation Profile: No results for input(s): INR, PROTIME in the last 168 hours. Thyroid Function Tests: Recent Labs    02/13/21 0340  TSH 0.585   Lipid Profile: No results for input(s): CHOL, HDL, LDLCALC, TRIG, CHOLHDL, LDLDIRECT in the last 72 hours. Anemia Panel: No results for input(s): VITAMINB12, FOLATE, FERRITIN, TIBC, IRON, RETICCTPCT in the last 72 hours. Urine analysis:    Component Value Date/Time   COLORURINE YELLOW 02/10/2021 1635   APPEARANCEUR CLEAR 02/10/2021 1635   LABSPEC 1.028 02/10/2021 1635   PHURINE 5.0 02/10/2021 1635   GLUCOSEU NEGATIVE 02/10/2021 1635   HGBUR NEGATIVE 02/10/2021 1635  BILIRUBINUR NEGATIVE 02/10/2021 1635   KETONESUR NEGATIVE 02/10/2021 1635   PROTEINUR 30 (A) 02/10/2021 1635   NITRITE NEGATIVE 02/10/2021 1635   LEUKOCYTESUR NEGATIVE 02/10/2021 1635   Sepsis Labs: Invalid input(s): PROCALCITONIN, LACTICIDVEN   Time coordinating discharge: 40 minutes  SIGNED:  Almon Hercules, MD  [facility] 02/13/2021, 2:13 PM  If 7PM-7AM, please contact night-coverage [email or web address]

## 2021-02-13 NOTE — TOC Progression Note (Signed)
Transition of Care [facility]) - Progression Note    Patient Details  Name: Kevin Stout MRN: [id number] Date of Birth: 12/12/1985  Transition of Care [facility]) CM/SW Contact  Geni Bers, RN Phone Number: 02/13/2021, 12:57 PM  Clinical Narrative:     Psych bed requested for this pt. IVC'd 4/12 at 8:58 PM end date 4/19 at 8:58 PM.        Expected Discharge Plan and Services                                                 Social Determinants of Health (SDOH) Interventions    Readmission Risk Interventions No flowsheet data found.

## 2021-02-13 NOTE — Progress Notes (Signed)
Patient admitted at shift change. He said he had been intubated as a result of a suicide attempt by overdosing on benadryl and sleep medication on Tuesday. He said he overdosed because his ex has a 50B out on him and is trying to keep him from seeing his children and he thought he would not be able to see them anymore. He says his ex smokes crack but is able to retain custody of children because she "slides around it somehow". He says he is homeless because his ex put him out of the house because she was smoking crack and he was not. He says there was a person who was trying to help him with his homelessness and he had done all that he could do for him. He denies any drug use other than smoking half a pack per day and occasional marijuana use and weekly alcohol use. He denies SI currently and contracts for safety. Denies HI and AVH. Contracts for safety. Pleasant and cooperative. Mood is sad. Affect is congruent.

## 2021-02-13 NOTE — BH Assessment (Signed)
Patient has been accepted to [facility].  Accepting physician is Dr. Neale Burly.  Attending  Physician will be Dr. Neale Burly.  Patient has been assigned to room 303, by [facility] Charge Nurse Deirdre Priest.   Call report to [phone].  Representative/Transfer Coordinator is Warden/ranger Patient pre-admitted by [facility] Patient Access Lynelle Doctor.,)

## 2021-02-13 NOTE — TOC Progression Note (Addendum)
Transition of Care [facility]) - Progression Note    Patient Details  Name: Kevin Stout MRN: [id number] Date of Birth: July 22, 1986  Transition [facility] Care [facility]) CM/SW Contact  Geni Bers, RN Phone Number: 02/13/2021, 2:17 PM  Clinical Narrative:    [facility] may have a bed for pt if COVID test is negative. MD and RN are aware. Pt and mother at bedside aware of bed offer.         Expected Discharge Plan and Services           Expected Discharge Date: 02/13/21                                     Social Determinants of Health (SDOH) Interventions    Readmission Risk Interventions No flowsheet data found.

## 2021-02-13 NOTE — Evaluation (Addendum)
Physical Therapy Evaluation Patient Details Name: Kevin Stout MRN: [id number] DOB: [birth year] Today's Date: 02/13/2021   History of Present Illness  35 year old M with PMH of marijuana use and tobacco use brought to ED by EMS after intentional overdose with unknown quantity of Benadryl, and admitted to ICU requiring intubation and mechanical ventilation for acute respiratory failure due to inability to protect his airway.  He also had 2 episodes of seizure enroute to ED per EMS report.  UDS positive for amphetamine, benzo and THC.  He was extubated on 02/11/2021.  Clinical Impression  Patient with very mild balance  Deficits when not using IV pole for stability. Patient can benefit from multiple walks daily, patient encouraged that he is more stable ambulating today.Pt admitted with above diagnosis.  Pt currently with functional limitations due to the deficits listed below (see PT Problem List). Pt will benefit from skilled PT to increase their independence and safety with mobility to allow discharge to the venue listed below.       Follow Up Recommendations No PT follow up    Equipment Recommendations  None recommended by PT    Recommendations for Other Services       Precautions / Restrictions Precautions Precautions: Fall Restrictions Weight Bearing Restrictions: No      Mobility  Bed Mobility Overal bed mobility: Modified Independent             General bed mobility comments: in recliner    Transfers Overall transfer level: Modified independent                  Ambulation/Gait Ambulation/Gait assistance: Supervision Gait Distance (Feet): 300 Feet Assistive device: IV Pole;None Gait Pattern/deviations: Decreased stride length;Drifts right/left Gait velocity: decr   General Gait Details: patient initially pushed IV pole. Then no pole. Noted gait is guarded and somewhat unsteady when turned corner and  turned head to challenge balance.  Stairs             Wheelchair Mobility    Modified Rankin (Stroke Patients Only)       Balance Overall balance assessment: Mild deficits observed, not formally tested                                           Pertinent Vitals/Pain Pain Assessment: No/denies pain    Home Living Family/patient expects to be discharged to:: Unsure (? inpatient psyche hospital)                      Prior Function Level of Independence: Independent         Comments: Patient independent at baseline. 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     Hand Dominance   Dominant Hand: Right    Extremity/Trunk Assessment   Upper Extremity Assessment Upper Extremity Assessment: Overall WFL for tasks assessed (WNL ROM and 5/5 strength throughout)    Lower Extremity Assessment Lower Extremity Assessment: Generalized weakness    Cervical / Trunk Assessment Cervical / Trunk Assessment: Normal  Communication   Communication: No difficulties  Cognition Arousal/Alertness: Awake/alert Behavior During Therapy: WFL for tasks assessed/performed Overall Cognitive Status: Within Functional Limits for tasks assessed  General Comments      Exercises  standing  Knee bends x 10, high marches x 10 standing.   Assessment/Plan    PT Assessment Patient needs continued PT services  PT Problem List Decreased strength;Decreased activity tolerance;Decreased mobility       PT Treatment Interventions Gait training;Functional mobility training;Patient/family education    PT Goals (Current goals can be found in the Care Plan section)  Acute Rehab PT Goals Patient Stated Goal: I want to get my balance back, walk more PT Goal Formulation: With patient Time For Goal Achievement: 02/27/21 Potential to Achieve Goals: Good    Frequency Min 2X/week   Barriers to discharge Decreased caregiver support      Co-evaluation                AM-PAC PT "6 Clicks" Mobility  Outcome Measure Help needed turning from your back to your side while in a flat bed without using bedrails?: None Help needed moving from lying on your back to sitting on the side of a flat bed without using bedrails?: None Help needed moving to and from a bed to a chair (including a wheelchair)?: A Little Help needed standing up from a chair using your arms (e.g., wheelchair or bedside chair)?: A Little Help needed to walk in hospital room?: A Little Help needed climbing 3-5 steps with a railing? : A Little 6 Click Score: 20    End of Session   Activity Tolerance: Patient tolerated treatment well Patient left: in chair;with call bell/phone within reach;with nursing/sitter in room Nurse Communication: Mobility status PT Visit Diagnosis: Other abnormalities of gait and mobility (R26.89)    Time: 1123-1140 PT Time Calculation (min) (ACUTE ONLY): 17 min   Charges:   PT Evaluation $PT Eval Low Complexity: 1 Low           Blanchard Kelch PT Acute Rehabilitation Services Pager [phone] Office [phone]   Rada Hay 02/13/2021, 2:15 PM

## 2021-02-13 NOTE — Evaluation (Signed)
Occupational Therapy Evaluation Patient Details Name: Kevin Stout MRN: [id number] DOB: 1986-02-27 Today's Date: 02/13/2021    History of Present Illness 35 year old M with PMH of marijuana use and tobacco use brought to ED by EMS after intentional overdose with unknown quantity of Benadryl, and admitted to ICU requiring intubation and mechanical ventilation for acute respiratory failure due to inability to protect his airway.  He also had 2 episodes of seizure enroute to ED per EMS report.  UDS positive for amphetamine, benzo and THC.  He was extubated on 02/11/2021.   Clinical Impression   Mr. Khristian Phillippi is a 35 year old man admitted with above medical history. On evaluation he demonstrates normal ROM, strength and coordination of upper extremities. He demonstrates ability to perform bed transfers and ambulation in room without assistance. Patient had no overt loss of balance and able to withstand anterior, lateral and posterior nudges. Patient able to perform all aspects of ADLs. No OT needs at this time.    Follow Up Recommendations  No OT follow up    Equipment Recommendations       Recommendations for Other Services       Precautions / Restrictions Precautions Precautions: None Restrictions Weight Bearing Restrictions: No      Mobility Bed Mobility Overal bed mobility: Modified Independent                  Transfers Overall transfer level: Modified independent                    Balance Overall balance assessment: No apparent balance deficits (not formally assessed)                                         ADL either performed or assessed with clinical judgement   ADL Overall ADL's : Modified independent                                       General ADL Comments: Demonstrates ability to perform all aspects of ADLs. Able to don socks, stand and perform bathing task at sink to wash body and hair and perform  toileting. Limited by IV and telemetry box only that therapist had to manage.     Vision Patient Visual Report: No change from baseline Vision Assessment?: No apparent visual deficits     Perception     Praxis      Pertinent Vitals/Pain Pain Assessment: No/denies pain     Hand Dominance Right   Extremity/Trunk Assessment Upper Extremity Assessment Upper Extremity Assessment: Overall WFL for tasks assessed (WNL ROM and 5/5 strength throughout)   Lower Extremity Assessment Lower Extremity Assessment: Defer to PT evaluation   Cervical / Trunk Assessment Cervical / Trunk Assessment: Normal   Communication Communication Communication: No difficulties   Cognition Arousal/Alertness: Awake/alert Behavior During Therapy: WFL for tasks assessed/performed Overall Cognitive Status: Within Functional Limits for tasks assessed                                     General Comments       Exercises     Shoulder Instructions      Home Living Family/patient expects to be discharged to:: Other (Comment)  Prior Functioning/Environment Level of Independence: Independent        Comments: Patient independent at baseline. 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        OT Problem List:        OT Treatment/Interventions:      OT Goals(Current goals can be found in the care plan section) Acute Rehab OT Goals OT Goal Formulation: All assessment and education complete, DC therapy  OT Frequency:     Barriers to D/C:            Co-evaluation              AM-PAC OT "6 Clicks" Daily Activity     Outcome Measure Help from another person eating meals?: None Help from another person taking care of personal grooming?: None Help from another person toileting, which includes using toliet, bedpan, or urinal?: None Help from another person bathing (including washing, rinsing, drying)?: None Help from  another person to put on and taking off regular upper body clothing?: None Help from another person to put on and taking off regular lower body clothing?: None 6 Click Score: 24   End of Session    Activity Tolerance: Patient tolerated treatment well Patient left: in chair;with call bell/phone within reach;with nursing/sitter in room  OT Visit Diagnosis: Muscle weakness (generalized) (M62.81)                Time: 1607-3710 OT Time Calculation (min): 18 min Charges:  OT General Charges $OT Visit: 1 Visit OT Evaluation $OT Eval Low Complexity: 1 Low  Gaudencio Chesnut, OTR/L Acute Care Rehab Services  Office [phone] Pager: [phone]   Kelli Churn 02/13/2021, 11:18 AM

## 2021-02-13 NOTE — Progress Notes (Signed)
Spoke with [facility] and they requested pt not be there until 7p or after.  [employer]. and requested transortation.  They are able to transport around 6p.  I informed them of request for pt to arrive at 7p or after.

## 2021-02-14 DIAGNOSIS — F332 Major depressive disorder, recurrent severe without psychotic features: Principal | ICD-10-CM

## 2021-02-14 DIAGNOSIS — F1410 Cocaine abuse, uncomplicated: Secondary | ICD-10-CM | POA: Diagnosis present

## 2021-02-14 MED ORDER — SERTRALINE HCL 25 MG PO TABS
50.0000 mg | ORAL_TABLET | Freq: Every day | ORAL | Status: DC
  Administered 2021-02-15 – 2021-02-17 (×3): 50 mg via ORAL
  Filled 2021-02-14 (×3): qty 2

## 2021-02-14 MED ORDER — VITAMIN D3 25 MCG PO TABS
1000.0000 [IU] | ORAL_TABLET | Freq: Every day | ORAL | Status: DC
  Administered 2021-02-14 – 2021-02-17 (×4): 1000 [IU] via ORAL
  Filled 2021-02-14 (×5): qty 1

## 2021-02-14 NOTE — Progress Notes (Signed)
Patient is calm and cooperative with assessment. Patient denies SI, HI, and AVH. However, he is still feeling sad and hopeless about not being able to see his children. Patient has insight into his situation and states that he "shouldn't have taken all those pills". Patient is appropriate with staff and peers. Patient remains medication compliant. Patient remains safe on the unit at this time and q15 min safety checks are maintained.

## 2021-02-14 NOTE — [facility] Group Notes (Signed)
[facility] Group Notes:  (Nursing/MHT/Case Management/Adjunct)  Date:  02/14/2021  Time:  8:51 PM  Type of Therapy:  Group Therapy  Participation Level:  Did Not Attend   Kevin Stout 02/14/2021, 8:51 PM

## 2021-02-14 NOTE — [facility] Counselor (Signed)
Patient interested in [facility] residential treatment; provided information and encouraged to complete intake interview at this time.   Signed:  Corky Crafts, MSW, [place], LCASA 02/14/2021 2:45 PM

## 2021-02-14 NOTE — [facility] Suicide Risk Assessment (Signed)
[id number] [facility] Admission Suicide Risk Assessment   Nursing information obtained from:  Patient Demographic factors:  Male,Caucasian,Divorced or widowed,Low socioeconomic status,Unemployed Current Mental Status:  Suicidal ideation indicated by patient,Suicidal ideation indicated by others,Self-harm behaviors Loss Factors:  Legal issues,Loss of significant relationship Historical Factors:  Impulsivity Risk Reduction Factors:  Sense of responsibility to family  Total Time spent with patient: 1 hour Principal Problem: MDD (major depressive disorder), recurrent episode, severe (HCC) Diagnosis:  Principal Problem:   MDD (major depressive disorder), recurrent episode, severe (HCC) Active Problems:   Vitamin D deficiency   Tobacco use disorder   Cocaine abuse (HCC)  Subjective Data: See intake note.  35 year old man transferred from [facility] after a serious suicide attempt in recovery in the hospital.  Patient continues to endorse symptoms of depression but is insightful and without psychotic symptoms.  Currently denies any suicidal ideation.  States a willingness to cooperate in treatment  Continued Clinical Symptoms:  Alcohol Use Disorder Identification Test Final Score (AUDIT): 2 The "Alcohol Use Disorders Identification Test", Guidelines for Use in Primary Care, Second Edition.  World Science writer [facility]). Score between 0-7:  no or low risk or alcohol related problems. Score between 8-15:  moderate risk of alcohol related problems. Score between 16-19:  high risk of alcohol related problems. Score 20 or above:  warrants further diagnostic evaluation for alcohol dependence and treatment.   CLINICAL FACTORS:   Depression:   Anhedonia Alcohol/Substance Abuse/Dependencies   Musculoskeletal: Strength & Muscle Tone: within normal limits Gait & Station: normal Patient leans: N/A  Psychiatric Specialty Exam:  Presentation  General Appearance: Disheveled  Eye  Contact:Minimal  Speech:Clear and Coherent; Normal Rate  Speech Volume:Decreased  Handedness:Right   Mood and Affect  Mood:Depressed; Dysphoric  Affect:Blunt; Depressed   Thought Process  Thought Processes:Coherent; Linear  Descriptions of Associations:Intact  Orientation:Full (Time, Place and Person)  Thought Content:Logical  History of Schizophrenia/Schizoaffective disorder:No data recorded Duration of Psychotic Symptoms:No data recorded Hallucinations:No data recorded Ideas of Reference:None  Suicidal Thoughts:No data recorded Homicidal Thoughts:No data recorded  Sensorium  Memory:Immediate Fair; Recent Fair; Remote Fair  Judgment:Fair  Insight:Present   Executive Functions  Concentration:Fair  Attention Span:Fair  Recall:Fair  Fund of Knowledge:Fair  Language:Fair   Psychomotor Activity  Psychomotor Activity:No data recorded  Assets  Assets:Communication Skills; Desire for Improvement; Financial Resources/Insurance; Physical Health; Talents/Skills; Social Support   Sleep  Sleep:No data recorded   Physical Exam: Physical Exam Vitals and nursing note reviewed.  Constitutional:      Appearance: Normal appearance.  HENT:     Head: Normocephalic and atraumatic.     Mouth/Throat:     Pharynx: Oropharynx is clear.  Eyes:     Pupils: Pupils are equal, round, and reactive to light.  Cardiovascular:     Rate and Rhythm: Normal rate and regular rhythm.  Pulmonary:     Effort: Pulmonary effort is normal.     Breath sounds: Normal breath sounds.  Abdominal:     General: Abdomen is flat.     Palpations: Abdomen is soft.  Musculoskeletal:        General: Normal range of motion.  Skin:    General: Skin is warm and dry.  Neurological:     General: No focal deficit present.     Mental Status: He is alert. Mental status is at baseline.  Psychiatric:        Attention and Perception: Attention normal.        Mood and Affect: Mood is depressed.  Affect  is blunt.        Speech: Speech is delayed.        Behavior: Behavior is slowed.        Thought Content: Thought content normal. Thought content is not paranoid. Thought content does not include homicidal or suicidal ideation.        Cognition and Memory: Memory is impaired.        Judgment: Judgment is impulsive.    Review of Systems  Constitutional: Negative.   HENT: Negative.   Eyes: Negative.   Respiratory: Negative.   Cardiovascular: Negative.   Gastrointestinal: Negative.   Musculoskeletal: Negative.   Skin: Negative.   Neurological: Negative.   Psychiatric/Behavioral: Positive for depression and substance abuse. Negative for hallucinations and suicidal ideas. The patient has insomnia.    Blood pressure 119/82, pulse 90, temperature 98.6 F (37 C), temperature source Oral, resp. rate 18, height 5\' 9"  (1.753 m), weight 59.8 kg, SpO2 96 %. Body mass index is 19.48 kg/m.   COGNITIVE FEATURES THAT CONTRIBUTE TO RISK:  None    SUICIDE RISK:   Mild:  Suicidal ideation of limited frequency, intensity, duration, and specificity.  There are no identifiable plans, no associated intent, mild dysphoria and related symptoms, good self-control (both objective and subjective assessment), few other risk factors, and identifiable protective factors, including available and accessible social support.  PLAN OF CARE: Patient will remain on 15-minute checks.  He has peers to be stable with his behavior right now and is not reporting active suicidal ideation and is able to identify positive things in his life.  Ongoing daily assessment including of suicidal ideation with focus on improving mood prior to discharge.  I certify that inpatient services furnished can reasonably be expected to improve the patient's condition.   , MD 02/14/2021, 11:00 AM

## 2021-02-14 NOTE — [facility] Suicide Risk Assessment (Signed)
[facility] INPATIENT:  Family/Significant Other Suicide Prevention Education  Suicide Prevention Education:  Education Completed; Kevin Stout,  (Mother, [phone]) has been identified by the patient as the family member/significant other with whom the patient will be residing, and identified as the person(s) who will aid the patient in the event of a mental health crisis (suicidal ideations/suicide attempt).  With written consent from the patient, the family member/significant other has been provided the following suicide prevention education, prior to the and/or following the discharge of the patient.  The suicide prevention education provided includes the following:  Suicide risk factors  Suicide prevention and interventions  National Suicide Hotline telephone number  [facility] assessment telephone number  [facility] Emergency Assistance 911  [facility] and/or Residential Mobile Crisis Unit telephone number  Request made of family/significant other to:  Remove weapons (e.g., guns, rifles, knives), all items previously/currently identified as safety concern.    Remove drugs/medications (over-the-counter, prescriptions, illicit drugs), all items previously/currently identified as a safety concern.  The family member/significant other verbalizes understanding of the suicide prevention education information provided.  The family member/significant other agrees to remove the items of safety concern listed above.  Kevin Stout 02/14/2021, 11:41 AM

## 2021-02-14 NOTE — [facility] Group Notes (Signed)
LCSW Group Therapy Note  02/14/2021 1:40 PM  Type of Therapy and Topic:  Group Therapy: Avoiding Self-Sabotaging and Enabling Behaviors  Participation Level:  Did Not Attend   Description of Group:   In this group, patients will learn how to identify obstacles, self-sabotaging and enabling behaviors, as well as: what are they, why do we do them and what needs these behaviors meet. Discuss unhealthy relationships and how to have positive healthy boundaries with those that sabotage and enable. Explore aspects of self-sabotage and enabling in yourself and how to limit these self-destructive behaviors in everyday life.   Therapeutic Goals: 1. Patient will identify one obstacle that relates to self-sabotage and enabling behaviors 2. Patient will identify one personal self-sabotaging or enabling behavior they did prior to admission 3. Patient will state a plan to change the above identified behavior 4. Patient will demonstrate ability to communicate their needs through discussion and/or role play.   Summary of Patient Progress: Patient did not attend group despite encouraged participation.    Therapeutic Modalities:   Cognitive Behavioral Therapy Person-Centered Therapy Motivational Interviewing   Gwenevere Ghazi, MSW, [place], [place] 02/14/2021 1:40 PM
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
## 2021-02-14 NOTE — H&P (Signed)
Addendum: After the H&P was completed I noted that the patient had already been started on low-dose Zoloft.  Doses below what would usually be helpful for depression so I will increase that to 50 mg.  I will not therefore start the citalopram I previously mentioned.  I am also adding vitamin D3 daily because of the labs showing a low level

## 2021-02-14 NOTE — H&P (Signed)
Psychiatric Admission Assessment Adult  Patient Identification: Kevin Stout MRN:  [id number] Date of Evaluation:  02/14/2021 Chief Complaint:  MDD (major depressive disorder), recurrent episode, severe (HCC) [F33.2] Principal Diagnosis: MDD (major depressive disorder), recurrent episode, severe (HCC) Diagnosis:  Principal Problem:   MDD (major depressive disorder), recurrent episode, severe (HCC) Active Problems:   Vitamin D deficiency   Tobacco use disorder   Cocaine abuse (HCC)  History of Present Illness: Patient seen and chart reviewed.  35 year old man transferred to [place] from [place].  He had presented there after an overdose on diphenhydramine resulting in respiratory compromise that required intensive care unit treatment.  Patient when evaluated admitted he had been attempting suicide.  Patient describes depression this been going on for weeks.  He says that he and his girlfriend had an argument about drug use.  He claims that he wanted to stop and she did not so she called the police and accused him falsely of assault so that now he cannot see his children.  Mood stays depressed.  Was not sleeping or eating well.  Was feeling hopeless and overwhelmed.  Denies any psychotic symptoms.  Admits that he took the medication overdose in an attempt to kill himself.  Today he says he is not having active suicidality but still feels depressed hopeless and has little idea what to do with himself.  Physically he is complaining of some sore throat and still having a little bit of coughing.  Denies any hallucinations.  Patient says that he last used any cocaine about a week prior to presenting for the overdose. Associated Signs/Symptoms: Depression Symptoms:  depressed mood, insomnia, difficulty concentrating, hopelessness, impaired memory, suicidal attempt, Duration of Depression Symptoms: No data recorded (Hypo) Manic Symptoms:  Distractibility, Impulsivity, Anxiety Symptoms:  Excessive  Worry, Psychotic Symptoms:  None reported PTSD Symptoms: Negative Total Time spent with patient: 1 hour  Past Psychiatric History: Patient has a past history of depressive symptoms and says that he had 1 suicide attempt years ago but cannot remember any details about it.  He says he has never seen a psychiatrist or been in a psychiatric hospital and had never been prescribed any psychiatric medicine.  He does have a history of substance abuse mostly cocaine also amphetamines and alcohol.  He has been through treatment programs in the past including having gone through the [facility] when incarcerated.  He also has experience with Narcotics Anonymous.  Denies any history of psychotic disorder or violence.  Is the patient at risk to self? Yes.    Has the patient been a risk to self in the past 6 months? Yes.    Has the patient been a risk to self within the distant past? Yes.    Is the patient a risk to others? No.  Has the patient been a risk to others in the past 6 months? No.  Has the patient been a risk to others within the distant past? No.   Prior Inpatient Therapy:   Prior Outpatient Therapy:    Alcohol Screening: 1. How often do you have a drink containing alcohol?: 2 to 4 times a month 2. How many drinks containing alcohol do you have on a typical day when you are drinking?: 1 or 2 3. How often do you have six or more drinks on one occasion?: Never AUDIT-C Score: 2 4. How often during the last year have you found that you were not able to stop drinking once you had started?: Never 5. How  often during the last year have you failed to do what was normally expected from you because of drinking?: Never 6. How often during the last year have you needed a first drink in the morning to get yourself going after a heavy drinking session?: Never 7. How often during the last year have you had a feeling of guilt of remorse after drinking?: Never 8. How often during the last year have you been  unable to remember what happened the night before because you had been drinking?: Never 9. Have you or someone else been injured as a result of your drinking?: No 10. Has a relative or friend or a doctor or another health worker been concerned about your drinking or suggested you cut down?: No Alcohol Use Disorder Identification Test Final Score (AUDIT): 2 Substance Abuse History in the last 12 months:  Yes.   Consequences of Substance Abuse: Worsening of depression as well as compromised support and lifestyle resulting in bad decisions such as suicide attempt Previous Psychotropic Medications: No  Psychological Evaluations: No  Past Medical History: History reviewed. No pertinent past medical history.  Past Surgical History:  Procedure Laterality Date  . HERNIA REPAIR    . ORIF TIBIA FRACTURE     Family History: History reviewed. No pertinent family history. Family Psychiatric  History: Patient says there is a positive family history of alcohol abuse and also he had 1 aunt who killed herself Tobacco Screening: Have you used any form of tobacco in the last 30 days? (Cigarettes, Smokeless Tobacco, Cigars, and/or Pipes): Yes Tobacco use, Select all that apply: 5 or more cigarettes per day Are you interested in Tobacco Cessation Medications?: Yes, will notify MD for an order Counseled patient on smoking cessation including recognizing danger situations, developing coping skills and basic information about quitting provided: Yes Social History:  Social History   Substance and Sexual Activity  Alcohol Use Yes   Comment: occ     Social History   Substance and Sexual Activity  Drug Use No    Additional Social History:                           Allergies:   Allergies  Allergen Reactions  . Vicodin Hp [Hydrocodone-Acetaminophen] Itching    Per 11/06/13 notes in computer from [facility]   Lab Results:  Results for orders placed or performed during the hospital encounter of  02/10/21 (from the past 48 hour(s))  Glucose, capillary     Status: None   Collection Time: 02/12/21 12:06 PM  Result Value Ref Range   Glucose-Capillary 84 70 - 99 mg/dL    Comment: Glucose reference range applies only to samples taken after fasting for at least 8 hours.  Glucose, capillary     Status: Abnormal   Collection Time: 02/12/21  7:03 PM  Result Value Ref Range   Glucose-Capillary 114 (H) 70 - 99 mg/dL    Comment: Glucose reference range applies only to samples taken after fasting for at least 8 hours.  Glucose, capillary     Status: Abnormal   Collection Time: 02/12/21 10:28 PM  Result Value Ref Range   Glucose-Capillary 127 (H) 70 - 99 mg/dL    Comment: Glucose reference range applies only to samples taken after fasting for at least 8 hours.   Comment 1 Notify RN    Comment 2 Document in Chart   Renal function panel     Status: Abnormal  Collection Time: 02/13/21  3:40 AM  Result Value Ref Range   Sodium 138 135 - 145 mmol/L   Potassium 3.8 3.5 - 5.1 mmol/L   Chloride 107 98 - 111 mmol/L   CO2 24 22 - 32 mmol/L   Glucose, Bld 88 70 - 99 mg/dL    Comment: Glucose reference range applies only to samples taken after fasting for at least 8 hours.   BUN 10 6 - 20 mg/dL   Creatinine, Ser 5.88 0.61 - 1.24 mg/dL   Calcium 8.3 (L) 8.9 - 10.3 mg/dL   Phosphorus 2.8 2.5 - 4.6 mg/dL   Albumin 3.5 3.5 - 5.0 g/dL   GFR, Estimated >50 >27 mL/min    Comment: (NOTE) Calculated using the CKD-EPI Creatinine Equation (2021)    Anion gap 7 5 - 15    Comment: Performed at [facility], [address] [place], [place] [address]  Magnesium     Status: None   Collection Time: 02/13/21  3:40 AM  Result Value Ref Range   Magnesium 2.1 1.7 - 2.4 mg/dL    Comment: Performed at [facility], [address], [place], [place] [address]  CBC     Status: Abnormal   Collection Time: 02/13/21  3:40 AM  Result Value Ref Range   WBC 13.9 (H) 4.0 - 10.5  K/uL   RBC 4.53 4.22 - 5.81 MIL/uL   Hemoglobin 14.0 13.0 - 17.0 g/dL   HCT 72.0 94.7 - 09.6 %   MCV 90.7 80.0 - 100.0 fL   MCH 30.9 26.0 - 34.0 pg   MCHC 34.1 30.0 - 36.0 g/dL   RDW 28.3 66.2 - 94.7 %   Platelets 193 150 - 400 K/uL   nRBC 0.0 0.0 - 0.2 %    Comment: Performed at [facility], [address], [place], [place] [address]  TSH     Status: None   Collection Time: 02/13/21  3:40 AM  Result Value Ref Range   TSH 0.585 0.350 - 4.500 uIU/mL    Comment: Performed by a 3rd Generation assay with a functional sensitivity of <=0.01 uIU/mL. Performed at [facility], [address], [place], [place] [address]   VITAMIN D 25 Hydroxy (Vit-D Deficiency, Fractures)     Status: Abnormal   Collection Time: 02/13/21  3:40 AM  Result Value Ref Range   Vit D, 25-Hydroxy 14.98 (L) 30 - 100 ng/mL    Comment: (NOTE) Vitamin D deficiency has been defined by the Institute of Medicine  and an Endocrine Society practice guideline as a level of serum 25-OH  vitamin D less than 20 ng/mL (1,2). The Endocrine Society went on to  further define vitamin D insufficiency as a level between 21 and 29  ng/mL (2).  1. IOM (Institute of Medicine). 2010. Dietary reference intakes for  calcium and D. [place]: [employer]. 2. Holick MF, Binkley , Bischoff-Ferrari HA, et al. Evaluation,  treatment, and prevention of vitamin D deficiency: an Endocrine  Society clinical practice guideline, JCEM. 2011 Jul; 96(7): 1911-30.  Performed at [facility], [address], [place], [place] [address]   Glucose, capillary     Status: None   Collection Time: 02/13/21  5:01 AM  Result Value Ref Range   Glucose-Capillary 94 70 - 99 mg/dL    Comment: Glucose reference range applies only to samples taken after fasting for at least 8 hours.   Comment 1  Notify RN    Comment 2 Document in Chart   Glucose, capillary     Status: None   Collection Time:  02/13/21 11:43 AM  Result Value Ref Range   Glucose-Capillary 79 70 - 99 mg/dL    Comment: Glucose reference range applies only to samples taken after fasting for at least 8 hours.  Resp Panel by RT-PCR (Flu A&B, Covid) Nasopharyngeal Swab     Status: None   Collection Time: 02/13/21  2:29 PM   Specimen: Nasopharyngeal Swab; Nasopharyngeal(NP) swabs in vial transport medium  Result Value Ref Range   SARS Coronavirus 2 by RT PCR NEGATIVE NEGATIVE    Comment: (NOTE) SARS-CoV-2 target nucleic acids are NOT DETECTED.  The SARS-CoV-2 RNA is generally detectable in upper respiratory specimens during the acute phase of infection. The lowest concentration of SARS-CoV-2 viral copies this assay can detect is 138 copies/mL. A negative result does not preclude SARS-Cov-2 infection and should not be used as the sole basis for treatment or other patient management decisions. A negative result may occur with  improper specimen collection/handling, submission of specimen other than nasopharyngeal swab, presence of viral mutation(s) within the areas targeted by this assay, and inadequate number of viral copies(<138 copies/mL). A negative result must be combined with clinical observations, patient history, and epidemiological information. The expected result is Negative.  Fact Sheet for Patients:  BloggerCourse.comhttps://www.fda.gov/media/152166/download  Fact Sheet for Healthcare Providers:  SeriousBroker.ithttps://www.fda.gov/media/152162/download  This test is no t yet approved or cleared by the [place] FDA and  has been authorized for detection and/or diagnosis of SARS-CoV-2 by FDA under an Emergency Use Authorization (EUA). This EUA will remain  in effect (meaning this test can be used) for the duration of the COVID-19 declaration under Section 564(b)(1) of the Act, 21 U.S.C.section 360bbb-3(b)(1), unless the authorization is terminated  or revoked sooner.       Influenza A by PCR NEGATIVE NEGATIVE   Influenza B  by PCR NEGATIVE NEGATIVE    Comment: (NOTE) The Xpert Xpress SARS-CoV-2/FLU/RSV plus assay is intended as an aid in the diagnosis of influenza from Nasopharyngeal swab specimens and should not be used as a sole basis for treatment. Nasal washings and aspirates are unacceptable for Xpert Xpress SARS-CoV-2/FLU/RSV testing.  Fact Sheet for Patients: BloggerCourse.comhttps://www.fda.gov/media/152166/download  Fact Sheet for Healthcare Providers: SeriousBroker.ithttps://www.fda.gov/media/152162/download  This test is not yet approved or cleared by the [place] FDA and has been authorized for detection and/or diagnosis of SARS-CoV-2 by FDA under an Emergency Use Authorization (EUA). This EUA will remain in effect (meaning this test can be used) for the duration of the COVID-19 declaration under Section 564(b)(1) of the Act, 21 U.S.C. section 360bbb-3(b)(1), unless the authorization is terminated or revoked.  Performed at [facility], [address] [place], [place] [address]     Blood Alcohol level:  Lab Results  Component Value Date   ETH <10 02/10/2021    Metabolic Disorder Labs:  No results found for: HGBA1C, MPG No results found for: PROLACTIN No results found for: CHOL, TRIG, HDL, CHOLHDL, VLDL, LDLCALC  Current Medications: Current Facility-Administered Medications  Medication Dose Route Frequency Provider Last Rate Last Admin  . acetaminophen (TYLENOL) tablet 650 mg  650 mg Oral Q8H PRN Jesse SansFreeman, Megan M, MD      . acetaminophen (TYLENOL) tablet 650 mg  650 mg Oral Q6H PRN Jesse SansFreeman, Megan M, MD      . alum & mag hydroxide-simeth (MAALOX/MYLANTA) 200-200-20 MG/5ML suspension 30 mL  30  mL Oral Q4H PRN Jesse Sans, MD      . docusate sodium (COLACE) capsule 100 mg  100 mg Oral BID PRN Jesse Sans, MD      . magnesium hydroxide (MILK OF MAGNESIA) suspension 30 mL  30 mL Oral Daily PRN Jesse Sans, MD      . menthol-cetylpyridinium (CEPACOL) lozenge 3 mg  1 lozenge  Oral PRN Jesse Sans, MD      . nicotine (NICODERM CQ - dosed in mg/24 hours) patch 14 mg  14 mg Transdermal Daily Jesse Sans, MD   14 mg at 02/14/21 0817  . pantoprazole (PROTONIX) EC tablet 40 mg  40 mg Oral BID Jesse Sans, MD   40 mg at 02/14/21 0817  . polyethylene glycol (MIRALAX / GLYCOLAX) packet 17 g  17 g Oral Daily PRN Jesse Sans, MD      . sertraline (ZOLOFT) tablet 25 mg  25 mg Oral Daily Jesse Sans, MD   25 mg at 02/14/21 0817  . traZODone (DESYREL) tablet 50 mg  50 mg Oral QHS PRN Jesse Sans, MD   50 mg at 02/13/21 2138   PTA Medications: Medications Prior to Admission  Medication Sig Dispense Refill Last Dose  . feeding supplement (ENSURE ENLIVE / ENSURE PLUS) LIQD Take 237 mLs by mouth 2 (two) times daily between meals. 237 mL 12   . nicotine (NICODERM CQ - DOSED IN MG/24 HOURS) 14 mg/24hr patch Place 1 patch (14 mg total) onto the skin daily. 28 patch 0   . pantoprazole (PROTONIX) 40 MG tablet Take 1 tablet (40 mg total) by mouth daily.     . polyethylene glycol (MIRALAX / GLYCOLAX) 17 g packet Take 17 g by mouth daily as needed for moderate constipation. 14 each 0   . sertraline (ZOLOFT) 25 MG tablet Take 1 tablet (25 mg total) by mouth daily.     . Vitamin D, Ergocalciferol, (DRISDOL) 1.25 MG (50000 UNIT) CAPS capsule Take 1 capsule (50,000 Units total) by mouth every 7 (seven) days for 8 doses. 8 capsule 0     Musculoskeletal: Strength & Muscle Tone: within normal limits Gait & Station: normal Patient leans: N/A            Psychiatric Specialty Exam:  Presentation  General Appearance: Disheveled  Eye Contact:Minimal  Speech:Clear and Coherent; Normal Rate  Speech Volume:Decreased  Handedness:Right   Mood and Affect  Mood:Depressed; Dysphoric  Affect:Blunt; Depressed   Thought Process  Thought Processes:Coherent; Linear  Duration of Psychotic Symptoms: No data recorded Past Diagnosis of Schizophrenia or  Psychoactive disorder: No data recorded Descriptions of Associations:Intact  Orientation:Full (Time, Place and Person)  Thought Content:Logical  Hallucinations:No data recorded Ideas of Reference:None  Suicidal Thoughts:No data recorded Homicidal Thoughts:No data recorded  Sensorium  Memory:Immediate Fair; Recent Fair; Remote Fair  Judgment:Fair  Insight:Present   Executive Functions  Concentration:Fair  Attention Span:Fair  Recall:Fair  Fund of Knowledge:Fair  Language:Fair   Psychomotor Activity  Psychomotor Activity:No data recorded  Assets  Assets:Communication Skills; Desire for Improvement; Financial Resources/Insurance; Physical Health; Talents/Skills; Social Support   Sleep  Sleep:No data recorded   Physical Exam: Physical Exam Vitals and nursing note reviewed.  Constitutional:      Appearance: Normal appearance.  HENT:     Head: Normocephalic and atraumatic.     Mouth/Throat:     Pharynx: Oropharynx is clear.  Eyes:     Pupils: Pupils are equal, round, and reactive  to light.  Cardiovascular:     Rate and Rhythm: Normal rate and regular rhythm.  Pulmonary:     Effort: Pulmonary effort is normal.     Breath sounds: Normal breath sounds.  Abdominal:     General: Abdomen is flat.     Palpations: Abdomen is soft.  Musculoskeletal:        General: Normal range of motion.  Skin:    General: Skin is warm and dry.  Neurological:     General: No focal deficit present.     Mental Status: He is alert. Mental status is at baseline.  Psychiatric:        Attention and Perception: Attention normal.        Mood and Affect: Mood is depressed. Affect is blunt.        Speech: Speech is delayed.        Behavior: Behavior is slowed.        Thought Content: Thought content normal. Thought content does not include homicidal or suicidal ideation.        Cognition and Memory: Memory is impaired.        Judgment: Judgment normal.    Review of Systems   Constitutional: Positive for malaise/fatigue. Negative for chills, fever and weight loss.  HENT: Negative.   Eyes: Negative.   Respiratory: Negative.   Cardiovascular: Negative.   Gastrointestinal: Negative.   Musculoskeletal: Negative.   Skin: Negative.   Neurological: Negative.   Psychiatric/Behavioral: Positive for depression, memory loss and substance abuse. The patient is nervous/anxious.    Blood pressure 119/82, pulse 90, temperature 98.6 F (37 C), temperature source Oral, resp. rate 18, height  (1.753 m), weight 59.8 kg, SpO2 96 %. Body mass index is 19.48 kg/m.  Treatment Plan Summary: Daily contact with patient to assess and evaluate symptoms and progress in treatment, Medication management and Plan 35 year old man status post serious suicide attempt.  Currently denies acute suicidal ideation but continues to be depressed hopeless and tired.  Physically pretty stable not having any active withdrawal that requires specific treatment.  Labs done while he was in the hospital showed low vitamin B3 but otherwise nothing remarkable that at this point needs extra treatment.  Vitals unremarkable.  Supportive therapy done with the patient with encouragement to be interactive talk to social work and nursing.  Asked him to start thinking about what his plans would be for the future and what he thought would be most helpful to getting him back to feeling stable.  I did suggest starting antidepressant medicine proposing starting citalopram 20 mg a day.  I am aware that he had an EKG with a QT corrected to 496 several days ago but he is not currently on any other medicine that would have an effect on QT and this is a good choice of medication being low and side effects and well tolerated as well as inexpensive.  Patient is agreeable to this.  We will continue to follow-up daily.  Observation Level/Precautions:  15 minute checks  Laboratory:  I do not see any other new labs that are needed to  be done at this point  Psychotherapy:    Medications:    Consultations:    Discharge Concerns:    Estimated LOS:  Other:     Physician Treatment Plan for Primary Diagnosis: MDD (major depressive disorder), recurrent episode, severe (HCC) Long Term Goal(s): Improvement in symptoms so as ready for discharge  Short Term Goals: Ability to verbalize feelings will  improve, Ability to disclose and discuss suicidal ideas and Ability to demonstrate self-control will improve  Physician Treatment Plan for Secondary Diagnosis: Principal Problem:   MDD (major depressive disorder), recurrent episode, severe (HCC) Active Problems:   Vitamin D deficiency   Tobacco use disorder   Cocaine abuse (HCC)  Long Term Goal(s): Improvement in symptoms so as ready for discharge  Short Term Goals: Ability to identify and develop effective coping behaviors will improve, Ability to maintain clinical measurements within normal limits will improve and Ability to identify triggers associated with substance abuse/mental health issues will improve  I certify that inpatient services furnished can reasonably be expected to improve the patient's condition.    Mordecai Rasmussen, MD 4/16/202210:50 AM

## 2021-02-15 DIAGNOSIS — F332 Major depressive disorder, recurrent severe without psychotic features: Secondary | ICD-10-CM | POA: Diagnosis not present

## 2021-02-15 NOTE — Progress Notes (Signed)
Patient is calm and cooperative. The patient has been compliant with medications. Denies SI/HI/ AVH. The patient reports that he slept fair last night, without sleep medication. He has had a pretty good appetite and his energy level has returned to normal, with the ability to concentrate and focus being good. Depression today is 4/10 and anxiety is a 3/10. Hopelessness was rated at 0/10. No physical pain noted. Goal for today is to focus on discharge plan. 15 min checks are continuous. Will continue to provide support and monitoring.

## 2021-02-15 NOTE — Plan of Care (Signed)
D: Assumed care of patient @ 1900. Patient denies SI/HI/AVH. Patient is laying in bed awake. No signs of distress or injury.  A: Patient was provided with verbal education on provided medications. Patient care plan was reviewed. Patient was offered support and encouragement. Patient was encourage to attend groups, participate in unit activities and continue with plan of care.    R: Patient is receptive to treatment and safety maintained on unit.     Problem: Education: Goal: Knowledge of [place] [facility] Education information/materials will improve Outcome: Not Progressing Goal: Emotional status will improve Outcome: Not Progressing Goal: Mental status will improve Outcome: Not Progressing Goal: Verbalization of understanding the information provided will improve Outcome: Not Progressing

## 2021-02-15 NOTE — Plan of Care (Signed)
  Problem: Education: Goal: Knowledge of [place] [facility] Education information/materials will improve Outcome: Progressing Goal: Emotional status will improve Outcome: Progressing Goal: Mental status will improve Outcome: Progressing Goal: Verbalization of understanding the information provided will improve Outcome: Progressing   Problem: Activity: Goal: Interest or engagement in activities will improve Outcome: Progressing Goal: Sleeping patterns will improve Outcome: Progressing   Problem: Coping: Goal: Ability to verbalize frustrations and anger appropriately will improve Outcome: Progressing Goal: Ability to demonstrate self-control will improve Outcome: Progressing   Problem: Health Behavior/Discharge Planning: Goal: Identification of resources available to assist in meeting health care needs will improve Outcome: Progressing Goal: Compliance with treatment plan for underlying cause of condition will improve Outcome: Progressing   Problem: Physical Regulation: Goal: Ability to maintain clinical measurements within normal limits will improve Outcome: Progressing   Problem: Safety: Goal: Periods of time without injury will increase Outcome: Progressing   Problem: Education: Goal: Ability to make informed decisions regarding treatment will improve Outcome: Progressing   Problem: Coping: Goal: Coping ability will improve Outcome: Progressing   Problem: Health Behavior/Discharge Planning: Goal: Identification of resources available to assist in meeting health care needs will improve Outcome: Progressing   Problem: Medication: Goal: Compliance with prescribed medication regimen will improve Outcome: Progressing   Problem: Self-Concept: Goal: Ability to disclose and discuss suicidal ideas will improve Outcome: Progressing Goal: Will verbalize positive feelings about self Outcome: Progressing   

## 2021-02-15 NOTE — Progress Notes (Signed)
[facility] MD Progress Note  02/15/2021 12:42 PM Kevin Stout  MRN:  [id number] Subjective: Follow-up for patient with recent depression and suicidal behavior.  Patient says he is feeling better today.  He is appreciative of the opportunity of getting to sleep as he says he had not been sleeping well for a long time.  No acute physical complaints.  Soreness is getting better.  Denies suicidal thoughts at this time.  Compliant with medicine.  No new lab studies today.  Vitals stable. Principal Problem: MDD (major depressive disorder), recurrent episode, severe (HCC) Diagnosis: Principal Problem:   MDD (major depressive disorder), recurrent episode, severe (HCC) Active Problems:   Vitamin D deficiency   Tobacco use disorder   Cocaine abuse (HCC)  Total Time spent with patient: 30 minutes  Past Psychiatric History: History of depression and drug abuse  Past Medical History: History reviewed. No pertinent past medical history.  Past Surgical History:  Procedure Laterality Date  . HERNIA REPAIR    . ORIF TIBIA FRACTURE     Family History: History reviewed. No pertinent family history. Family Psychiatric  History: See previous Social History:  Social History   Substance and Sexual Activity  Alcohol Use Yes   Comment: occ     Social History   Substance and Sexual Activity  Drug Use No    Social History   Socioeconomic History  . Marital status: Single    Spouse name: Not on file  . Number of children: Not on file  . Years of education: Not on file  . Highest education level: Not on file  Occupational History  . Not on file  Tobacco Use  . Smoking status: Current Every Day Smoker    Packs/day: 5.00    Types: Cigarettes  . Smokeless tobacco: Never Used  Substance and Sexual Activity  . Alcohol use: Yes    Comment: occ  . Drug use: No  . Sexual activity: Yes  Other Topics Concern  . Not on file  Social History Narrative  . Not on file   Social Determinants of Health    Financial Resource Strain: Not on file  Food Insecurity: Not on file  Transportation Needs: Not on file  Physical Activity: Not on file  Stress: Not on file  Social Connections: Not on file   Additional Social History:                         Sleep: Fair  Appetite:  Fair  Current Medications: Current Facility-Administered Medications  Medication Dose Route Frequency Provider Last Rate Last Admin  . acetaminophen (TYLENOL) tablet 650 mg  650 mg Oral Q8H PRN Jesse Sans, MD      . acetaminophen (TYLENOL) tablet 650 mg  650 mg Oral Q6H PRN Jesse Sans, MD      . alum & mag hydroxide-simeth (MAALOX/MYLANTA) 200-200-20 MG/5ML suspension 30 mL  30 mL Oral Q4H PRN Jesse Sans, MD      . docusate sodium (COLACE) capsule 100 mg  100 mg Oral BID PRN Jesse Sans, MD      . magnesium hydroxide (MILK OF MAGNESIA) suspension 30 mL  30 mL Oral Daily PRN Jesse Sans, MD      . menthol-cetylpyridinium (CEPACOL) lozenge 3 mg  1 lozenge Oral PRN Jesse Sans, MD      . nicotine (NICODERM CQ - dosed in mg/24 hours) patch 14 mg  14 mg Transdermal Daily Neale Burly,  Tylene Fantasia, MD   14 mg at 02/15/21 1053  . pantoprazole (PROTONIX) EC tablet 40 mg  40 mg Oral BID Jesse Sans, MD   40 mg at 02/15/21 6712  . polyethylene glycol (MIRALAX / GLYCOLAX) packet 17 g  17 g Oral Daily PRN Jesse Sans, MD      . sertraline (ZOLOFT) tablet 50 mg  50 mg Oral Daily Aldine Grainger, Jackquline Denmark, MD   50 mg at 02/15/21 0845  . traZODone (DESYREL) tablet 50 mg  50 mg Oral QHS PRN Jesse Sans, MD   50 mg at 02/13/21 2138  . Vitamin D3 (Vitamin D) tablet 1,000 Units  1,000 Units Oral Daily Graceanna Theissen, Jackquline Denmark, MD   1,000 Units at 02/15/21 [phone]    Lab Results:  Results for orders placed or performed during the hospital encounter of 02/10/21 (from the past 48 hour(s))  Resp Panel by RT-PCR (Flu A&B, Covid) Nasopharyngeal Swab     Status: None   Collection Time: 02/13/21  2:29 PM   Specimen:  Nasopharyngeal Swab; Nasopharyngeal(NP) swabs in vial transport medium  Result Value Ref Range   SARS Coronavirus 2 by RT PCR NEGATIVE NEGATIVE    Comment: (NOTE) SARS-CoV-2 target nucleic acids are NOT DETECTED.  The SARS-CoV-2 RNA is generally detectable in upper respiratory specimens during the acute phase of infection. The lowest concentration of SARS-CoV-2 viral copies this assay can detect is 138 copies/mL. A negative result does not preclude SARS-Cov-2 infection and should not be used as the sole basis for treatment or other patient management decisions. A negative result may occur with  improper specimen collection/handling, submission of specimen other than nasopharyngeal swab, presence of viral mutation(s) within the areas targeted by this assay, and inadequate number of viral copies(<138 copies/mL). A negative result must be combined with clinical observations, patient history, and epidemiological information. The expected result is Negative.  Fact Sheet for Patients:  BloggerCourse.com  Fact Sheet for Healthcare Providers:  SeriousBroker.it  This test is no t yet approved or cleared by the [place] FDA and  has been authorized for detection and/or diagnosis of SARS-CoV-2 by FDA under an Emergency Use Authorization (EUA). This EUA will remain  in effect (meaning this test can be used) for the duration of the COVID-19 declaration under Section 564(b)(1) of the Act, 21 U.S.C.section 360bbb-3(b)(1), unless the authorization is terminated  or revoked sooner.       Influenza A by PCR NEGATIVE NEGATIVE   Influenza B by PCR NEGATIVE NEGATIVE    Comment: (NOTE) The Xpert Xpress SARS-CoV-2/FLU/RSV plus assay is intended as an aid in the diagnosis of influenza from Nasopharyngeal swab specimens and should not be used as a sole basis for treatment. Nasal washings and aspirates are unacceptable for Xpert Xpress  SARS-CoV-2/FLU/RSV testing.  Fact Sheet for Patients: BloggerCourse.com  Fact Sheet for Healthcare Providers: SeriousBroker.it  This test is not yet approved or cleared by the [place] FDA and has been authorized for detection and/or diagnosis of SARS-CoV-2 by FDA under an Emergency Use Authorization (EUA). This EUA will remain in effect (meaning this test can be used) for the duration of the COVID-19 declaration under Section 564(b)(1) of the Act, 21 U.S.C. section 360bbb-3(b)(1), unless the authorization is terminated or revoked.  Performed at [facility], [address] [place], [place] [address]     Blood Alcohol level:  Lab Results  Component Value Date   [facility] <10 02/10/2021    Metabolic Disorder Labs:  No results found for: HGBA1C, MPG No results found for: PROLACTIN No results found for: CHOL, TRIG, HDL, CHOLHDL, VLDL, LDLCALC  Physical Findings: AIMS: Facial and Oral Movements Muscles of Facial Expression: None, normal Lips and Perioral Area: None, normal Jaw: None, normal Tongue: None, normal,Extremity Movements Upper (arms, wrists, hands, fingers): None, normal Lower (legs, knees, ankles, toes): None, normal, Trunk Movements Neck, shoulders, hips: None, normal, Overall Severity Severity of abnormal movements (highest score from questions above): None, normal Incapacitation due to abnormal movements: None, normal Patient's awareness of abnormal movements (rate only patient's report): No Awareness, Dental Status Current problems with teeth and/or dentures?: No Does patient usually wear dentures?: No  CIWA:    COWS:     Musculoskeletal: Strength & Muscle Tone: within normal limits Gait & Station: normal Patient leans: N/A  Psychiatric Specialty Exam:  Presentation  General Appearance: Disheveled  Eye Contact:Minimal  Speech:Clear and Coherent; Normal Rate  Speech  Volume:Decreased  Handedness:Right   Mood and Affect  Mood:Depressed; Dysphoric  Affect:Blunt; Depressed   Thought Process  Thought Processes:Coherent; Linear  Descriptions of Associations:Intact  Orientation:Full (Time, Place and Person)  Thought Content:Logical  History of Schizophrenia/Schizoaffective disorder:No data recorded Duration of Psychotic Symptoms:No data recorded Hallucinations:No data recorded Ideas of Reference:None  Suicidal Thoughts:No data recorded Homicidal Thoughts:No data recorded  Sensorium  Memory:Immediate Fair; Recent Fair; Remote Fair  Judgment:Fair  Insight:Present   Executive Functions  Concentration:Fair  Attention Span:Fair  Recall:Fair  Fund of Knowledge:Fair  Language:Fair   Psychomotor Activity  Psychomotor Activity:No data recorded  Assets  Assets:Communication Skills; Desire for Improvement; Financial Resources/Insurance; Physical Health; Talents/Skills; Social Support   Sleep  Sleep:No data recorded   Physical Exam: Physical Exam Vitals and nursing note reviewed.  Constitutional:      Appearance: Normal appearance.  HENT:     Head: Normocephalic and atraumatic.     Mouth/Throat:     Pharynx: Oropharynx is clear.  Eyes:     Pupils: Pupils are equal, round, and reactive to light.  Cardiovascular:     Rate and Rhythm: Normal rate and regular rhythm.  Pulmonary:     Effort: Pulmonary effort is normal.     Breath sounds: Normal breath sounds.  Abdominal:     General: Abdomen is flat.     Palpations: Abdomen is soft.  Musculoskeletal:        General: Normal range of motion.  Skin:    General: Skin is warm and dry.  Neurological:     General: No focal deficit present.     Mental Status: He is alert. Mental status is at baseline.  Psychiatric:        Attention and Perception: Attention normal.        Mood and Affect: Mood normal. Affect is blunt.        Speech: Speech is delayed.        Behavior:  Behavior is slowed.        Thought Content: Thought content normal.        Cognition and Memory: Memory is impaired.        Judgment: Judgment normal.    Review of Systems  Constitutional: Negative.   HENT: Negative.   Eyes: Negative.   Respiratory: Negative.   Cardiovascular: Negative.   Gastrointestinal: Negative.   Musculoskeletal: Negative.   Skin: Negative.   Neurological: Negative.   Psychiatric/Behavioral: Negative.    Blood pressure 122/89, pulse 73, temperature 98.4 F (36.9 C), temperature source Oral, resp. rate 18, height 5'  9" (1.753 m), weight 59.8 kg, SpO2 95 %. Body mass index is 19.48 kg/m.   Treatment Plan Summary: Plan Patient mostly resting.  Eating okay however.  Physically improving.  Acknowledges recent depression and suicidal thoughts but says he is feeling better and is glad of it.  No indication to change medications at this point.  Supportive counseling and therapy.  Mordecai Rasmussen, MD 02/15/2021, 12:42 PM

## 2021-02-15 NOTE — Progress Notes (Signed)
Patient has been calm and cooperative. Mood continues to be sad. Affect is congruent. Denies SI and HI. Voices no complaints

## 2021-02-16 ENCOUNTER — Other Ambulatory Visit: Payer: Self-pay

## 2021-02-16 DIAGNOSIS — F332 Major depressive disorder, recurrent severe without psychotic features: Secondary | ICD-10-CM | POA: Diagnosis not present

## 2021-02-16 MED ORDER — SERTRALINE HCL 50 MG PO TABS
50.0000 mg | ORAL_TABLET | Freq: Every day | ORAL | 0 refills | Status: DC
  Filled 2021-02-16: qty 7, 7d supply, fill #0

## 2021-02-16 MED ORDER — VITAMIN D3 25 MCG PO TABS
1000.0000 [IU] | ORAL_TABLET | Freq: Every day | ORAL | 0 refills | Status: DC
  Filled 2021-02-16: qty 7, 7d supply, fill #0

## 2021-02-16 MED ORDER — PANTOPRAZOLE SODIUM 40 MG PO TBEC
40.0000 mg | DELAYED_RELEASE_TABLET | Freq: Two times a day (BID) | ORAL | 0 refills | Status: DC
  Filled 2021-02-16: qty 14, 7d supply, fill #0

## 2021-02-16 NOTE — Progress Notes (Signed)
Recreation Therapy Notes   Date: 02/16/2021  Time: 9:30 am   Location: Craft room     Behavioral response: N/A   Intervention Topic: Time-Management   Discussion/Intervention: Patient did not attend group.   Clinical Observations/Feedback:  Patient did not attend group.   Araseli Sherry LRT/CTRS         Dalena Plantz 02/16/2021 10:45 AM

## 2021-02-16 NOTE — Tx Team (Signed)
Interdisciplinary Treatment and Diagnostic Plan Update  02/16/2021 Time of Session: 9:00AM Kevin Stout MRN: [id number]  Principal Diagnosis: MDD (major depressive disorder), recurrent episode, severe ([place])  Secondary Diagnoses: Principal Problem:   MDD (major depressive disorder), recurrent episode, severe ([place]) Active Problems:   Vitamin D deficiency   Tobacco use disorder   Cocaine abuse ([place])   Current Medications:  Current Facility-Administered Medications  Medication Dose Route Frequency Provider Last Rate Last Admin  . acetaminophen (TYLENOL) tablet 650 mg  650 mg Oral Q8H PRN Salley Scarlet, MD      . acetaminophen (TYLENOL) tablet 650 mg  650 mg Oral Q6H PRN Salley Scarlet, MD      . alum & mag hydroxide-simeth (MAALOX/MYLANTA) 200-200-20 MG/5ML suspension 30 mL  30 mL Oral Q4H PRN Salley Scarlet, MD      . docusate sodium (COLACE) capsule 100 mg  100 mg Oral BID PRN Salley Scarlet, MD      . magnesium hydroxide (MILK OF MAGNESIA) suspension 30 mL  30 mL Oral Daily PRN Salley Scarlet, MD      . menthol-cetylpyridinium (CEPACOL) lozenge 3 mg  1 lozenge Oral PRN Salley Scarlet, MD      . nicotine (NICODERM CQ - dosed in mg/24 hours) patch 14 mg  14 mg Transdermal Daily Salley Scarlet, MD   14 mg at 02/16/21 0803  . pantoprazole (PROTONIX) EC tablet 40 mg  40 mg Oral BID Salley Scarlet, MD   40 mg at 02/16/21 0803  . polyethylene glycol (MIRALAX / GLYCOLAX) packet 17 g  17 g Oral Daily PRN Salley Scarlet, MD      . sertraline (ZOLOFT) tablet 50 mg  50 mg Oral Daily Clapacs, Madie Reno, MD   50 mg at 02/16/21 0802  . traZODone (DESYREL) tablet 50 mg  50 mg Oral QHS PRN Salley Scarlet, MD   50 mg at 02/13/21 2138  . Vitamin D3 (Vitamin D) tablet 1,000 Units  1,000 Units Oral Daily Clapacs, Madie Reno, MD   1,000 Units at 02/16/21 0803   PTA Medications: Medications Prior to Admission  Medication Sig Dispense Refill Last Dose  . feeding supplement (ENSURE ENLIVE /  ENSURE PLUS) LIQD Take 237 mLs by mouth 2 (two) times daily between meals. 237 mL 12   . nicotine (NICODERM CQ - DOSED IN MG/24 HOURS) 14 mg/24hr patch Place 1 patch (14 mg total) onto the skin daily. 28 patch 0   . pantoprazole (PROTONIX) 40 MG tablet Take 1 tablet (40 mg total) by mouth daily.     . polyethylene glycol (MIRALAX / GLYCOLAX) 17 g packet Take 17 g by mouth daily as needed for moderate constipation. 14 each 0   . sertraline (ZOLOFT) 25 MG tablet Take 1 tablet (25 mg total) by mouth daily.     . Vitamin D, Ergocalciferol, (DRISDOL) 1.25 MG (50000 UNIT) CAPS capsule Take 1 capsule (50,000 Units total) by mouth every 7 (seven) days for 8 doses. 8 capsule 0     Patient Stressors:    Patient Strengths:    Treatment Modalities: Medication Management, Group therapy, Case management,  1 to 1 session with clinician, Psychoeducation, Recreational therapy.   Physician Treatment Plan for Primary Diagnosis: MDD (major depressive disorder), recurrent episode, severe ([place]) Long Term Goal(s): Improvement in symptoms so as ready for discharge Improvement in symptoms so as ready for discharge   Short Term Goals: Ability to verbalize feelings will improve  Ability to disclose and discuss suicidal ideas Ability to demonstrate self-control will improve Ability to identify and develop effective coping behaviors will improve Ability to maintain clinical measurements within normal limits will improve Ability to identify triggers associated with substance abuse/mental health issues will improve  Medication Management: Evaluate patient's response, side effects, and tolerance of medication regimen.  Therapeutic Interventions: 1 to 1 sessions, Unit Group sessions and Medication administration.  Evaluation of Outcomes: Not Met  Physician Treatment Plan for Secondary Diagnosis: Principal Problem:   MDD (major depressive disorder), recurrent episode, severe ([place]) Active Problems:   Vitamin D  deficiency   Tobacco use disorder   Cocaine abuse ([place])  Long Term Goal(s): Improvement in symptoms so as ready for discharge Improvement in symptoms so as ready for discharge   Short Term Goals: Ability to verbalize feelings will improve Ability to disclose and discuss suicidal ideas Ability to demonstrate self-control will improve Ability to identify and develop effective coping behaviors will improve Ability to maintain clinical measurements within normal limits will improve Ability to identify triggers associated with substance abuse/mental health issues will improve     Medication Management: Evaluate patient's response, side effects, and tolerance of medication regimen.  Therapeutic Interventions: 1 to 1 sessions, Unit Group sessions and Medication administration.  Evaluation of Outcomes: Not Met   RN Treatment Plan for Primary Diagnosis: MDD (major depressive disorder), recurrent episode, severe ([place]) Long Term Goal(s): Knowledge of disease and therapeutic regimen to maintain health will improve  Short Term Goals: Ability to remain free from injury will improve, Ability to verbalize frustration and anger appropriately will improve, Ability to demonstrate self-control, Ability to participate in decision making will improve, Ability to verbalize feelings will improve, Ability to disclose and discuss suicidal ideas, Ability to identify and develop effective coping behaviors will improve and Compliance with prescribed medications will improve  Medication Management: RN will administer medications as ordered by provider, will assess and evaluate patient's response and provide education to patient for prescribed medication. RN will report any adverse and/or side effects to prescribing provider.  Therapeutic Interventions: 1 on 1 counseling sessions, Psychoeducation, Medication administration, Evaluate responses to treatment, Monitor vital signs and CBGs as ordered, Perform/monitor CIWA,  COWS, AIMS and Fall Risk screenings as ordered, Perform wound care treatments as ordered.  Evaluation of Outcomes: Not Met   LCSW Treatment Plan for Primary Diagnosis: MDD (major depressive disorder), recurrent episode, severe ([place]) Long Term Goal(s): Safe transition to appropriate next level of care at discharge, Engage patient in therapeutic group addressing interpersonal concerns.  Short Term Goals: Engage patient in aftercare planning with referrals and resources, Increase social support, Increase ability to appropriately verbalize feelings, Increase emotional regulation, Facilitate patient progression through stages of change regarding substance use diagnoses and concerns, Identify triggers associated with mental health/substance abuse issues and Increase skills for wellness and recovery  Therapeutic Interventions: Assess for all discharge needs, 1 to 1 time with Social worker, Explore available resources and support systems, Assess for adequacy in community support network, Educate family and significant other(s) on suicide prevention, Complete Psychosocial Assessment, Interpersonal group therapy.  Evaluation of Outcomes: Not Met   Progress in Treatment: Attending groups: No. Participating in groups: No. Taking medication as prescribed: Yes. Toleration medication: Yes. Family/Significant other contact made: Yes, individual(s) contacted:  Doyce Para, mother. Patient understands diagnosis: Yes. Discussing patient identified problems/goals with staff: Yes. Medical problems stabilized or resolved: Yes. Denies suicidal/homicidal ideation: Yes. Issues/concerns per patient self-inventory: No. Other: None.  New problem(s) identified:  No, Describe:  None.  New Short Term/Long Term Goal(s): medication management for mood stabilization; elimination of SI thoughts; development of comprehensive mental wellness/sobriety plan.  Patient Goals: "Anything helpful would be good." Pt expresses  interest in better managing his emotions.   Discharge Plan or Barriers: CSW to assist with development of an appropriate discharge/aftercare plan.  Reason for Continuation of Hospitalization: Anxiety Depression Medication stabilization Suicidal ideation  Estimated Length of Stay: 1-7 days  Attendees: Patient: Kevin Stout 02/16/2021 9:15 AM  Physician: Selina Cooley, MD 02/16/2021 9:15 AM  Nursing: Marla Roe, RN 02/16/2021 9:15 AM  RN Care Manager: 02/16/2021 9:15 AM  Social Worker: Chalmers Guest. Guerry Bruin, MSW, [place], [place] 02/16/2021 9:15 AM  Recreational Therapist: Devin Going, LRT  02/16/2021 9:15 AM  Other:  02/16/2021 9:15 AM  Other:  02/16/2021 9:15 AM  Other: 02/16/2021 9:15 AM    Scribe for Treatment Team: Shirl Harris, LCSW 02/16/2021 9:15 AM

## 2021-02-16 NOTE — [facility] Group Notes (Signed)
LCSW Group Therapy Note   02/16/2021 1:42 PM  Type of Therapy and Topic:  Group Therapy:  Overcoming Obstacles   Participation Level:  Did Not Attend   Description of Group:    In this group patients will be encouraged to explore what they see as obstacles to their own wellness and recovery. They will be guided to discuss their thoughts, feelings, and behaviors related to these obstacles. The group will process together ways to cope with barriers, with attention given to specific choices patients can make. Each patient will be challenged to identify changes they are motivated to make in order to overcome their obstacles. This group will be process-oriented, with patients participating in exploration of their own experiences as well as giving and receiving support and challenge from other group members.   Therapeutic Goals: 1. Patient will identify personal and current obstacles as they relate to admission. 2. Patient will identify barriers that currently interfere with their wellness or overcoming obstacles.  3. Patient will identify feelings, thought process and behaviors related to these barriers. 4. Patient will identify two changes they are willing to make to overcome these obstacles:      Summary of Patient Progress X    Therapeutic Modalities:   Cognitive Behavioral Therapy Solution Focused Therapy Motivational Interviewing Relapse Prevention Therapy  Kevin Stout R. Algis Greenhouse, MSW, LCSW, LCAS 02/16/2021 1:42 PM

## 2021-02-16 NOTE — Progress Notes (Signed)
Recreation Therapy Notes  INPATIENT RECREATION THERAPY ASSESSMENT  Patient Details Name: Kevin Stout MRN: [id number] DOB: 03/13/1986 Today's Date: 02/16/2021       Information Obtained From: Patient  Able to Participate in Assessment/Interview: Yes  Patient Presentation: Responsive  Reason for Admission (Per Patient): Active Symptoms,Suicidal Ideation,Suicide Attempt  Patient Stressors: Family,Relationship  Coping Skills:   TV,Other (Comment) (Work,Smoke)  Leisure Interests (2+):  Individual - TV,Music - [employer])  Frequency of Recreation/Participation: Monthly  Awareness of Community Resources:  Yes  Community Resources:  [employer]  Current Use: Yes  If no, Barriers?:    Expressed Interest in [employer] Information: No  County of Residence:  [place]  Patient Main Form of Transportation: Walk  Patient Strengths:  Understanding  Patient Identified Areas of Improvement:  Patience  Patient Goal for Hospitalization:  Get help and be more of a leader  Current SI (including self-harm):  No  Current HI:  No  Current AVH: No  Staff Intervention Plan: Group Attendance,Collaborate with Interdisciplinary Treatment Team  Consent to Intern Participation: N/A  Kevin Stout 02/16/2021, 3:15 PM

## 2021-02-16 NOTE — Progress Notes (Signed)
   02/16/21 0745  Clinical Encounter Type  Visited With Patient  Visit Type Initial;Spiritual support;Social support  Referral From Chaplain  Consult/Referral To Chaplain   Chaplain visited Pt while doing rounds. PT was able to express his emotions. Chaplain ministered with spiritual and emotional support.

## 2021-02-16 NOTE — Progress Notes (Signed)
Patient denies SI, HI, and AVH this morning. On assessment, he is pleasant but appears sad. He endorses depression. Patient states he is feeling much better than he did when he first came to the unit. Patient denies pain or other physical problems. Patient wants to work on figuring out where he will go after discharge. He states he does not believe substance abuse is his main problem, but wants to work on controlling his emotions. Patient is motivated for treatment and is active in care plan. He is observed to be interacting appropriately with staff and peers. Patient remains safe on the unit at this time and q15 minute safety checks are maintained.

## 2021-02-16 NOTE — Progress Notes (Signed)
Recreation Therapy Notes  INPATIENT RECREATION TR PLAN  Patient Details Name: Kevin Stout MRN: [id number] DOB: January 11, 1986 Today's Date: 02/16/2021  Rec Therapy Plan Is patient appropriate for Therapeutic Recreation?: Yes Treatment times per week: at least 3 Estimated Length of Stay: 5-7 days TR Treatment/Interventions: Group participation (Comment)  Discharge Criteria Pt will be discharged from therapy if:: Discharged Treatment plan/goals/alternatives discussed and agreed upon by:: Patient/family  Discharge Summary     Maninder Deboer 02/16/2021, 3:17 PM

## 2021-02-16 NOTE — Progress Notes (Signed)
[facility] MD Progress Note  02/16/2021 2:33 PM Kevin Stout  MRN:  [id number]  CC "I've done a complete 180 from last Tuesday."  Subjective:  35 year old male who presents to [place] from outside hospital after suicide attempt via OD on benadryl resulting in ICU and intubation. Patient seen during treatment team and again one-on-one at bedside. He notes that he has "done a full 180" from last Tuesday. He clarifies this means he feels much better, and cannot fathom another suicide attempt. He discusses that his girlfriend of 8-years left him and is attempting to take custody of their 10-year-old daughter and 46-year-old son. This caused him to not be able to eat or sleep and become hopeless and overwhelmed. At time time he felt he had no other option than to end his life, and had hoped to do so painlessly by dying in his sleep via Benadryl. However, he notes that he woke up intubated and it was the scariest moment he could have imagined. He states he is happy to be alive, and cannot imagine trying again. He feels guilty attempting suicide and leaving his children behind, and upsetting his mother. He is very future oriented at this time looking to restart NA meetings, get sober, start working again, and finding a new place to live. He has an uncle who completed the [employer], and he is interested in going there as well. He denies suicidal ideations, homicidal ideations, visual hallucinations, and auditory hallucinations. He denies any side effects to his medications.   Principal Problem: MDD (major depressive disorder), recurrent episode, severe (HCC) Diagnosis: Principal Problem:   MDD (major depressive disorder), recurrent episode, severe (HCC) Active Problems:   Vitamin D deficiency   Tobacco use disorder   Cocaine abuse (HCC)  Total Time spent with patient: 30 minutes  Past Psychiatric History: See H&P  Past Medical History: History reviewed. No pertinent past medical history.  Past  Surgical History:  Procedure Laterality Date  . HERNIA REPAIR    . ORIF TIBIA FRACTURE     Family History: History reviewed. No pertinent family history. Family Psychiatric  History: See H&P Social History:  Social History   Substance and Sexual Activity  Alcohol Use Yes   Comment: occ     Social History   Substance and Sexual Activity  Drug Use No    Social History   Socioeconomic History  . Marital status: Single    Spouse name: Not on file  . Number of children: Not on file  . Years of education: Not on file  . Highest education level: Not on file  Occupational History  . Not on file  Tobacco Use  . Smoking status: Current Every Day Smoker    Packs/day: 5.00    Types: Cigarettes  . Smokeless tobacco: Never Used  Substance and Sexual Activity  . Alcohol use: Yes    Comment: occ  . Drug use: No  . Sexual activity: Yes  Other Topics Concern  . Not on file  Social History Narrative  . Not on file   Social Determinants of Health   Financial Resource Strain: Not on file  Food Insecurity: Not on file  Transportation Needs: Not on file  Physical Activity: Not on file  Stress: Not on file  Social Connections: Not on file   Additional Social History:                         Sleep: Good  Appetite:  Fair  Current Medications: Current Facility-Administered Medications  Medication Dose Route Frequency Provider Last Rate Last Admin  . acetaminophen (TYLENOL) tablet 650 mg  650 mg Oral Q8H PRN Jesse Sans, MD      . acetaminophen (TYLENOL) tablet 650 mg  650 mg Oral Q6H PRN Jesse Sans, MD      . alum & mag hydroxide-simeth (MAALOX/MYLANTA) 200-200-20 MG/5ML suspension 30 mL  30 mL Oral Q4H PRN Jesse Sans, MD      . docusate sodium (COLACE) capsule 100 mg  100 mg Oral BID PRN Jesse Sans, MD      . magnesium hydroxide (MILK OF MAGNESIA) suspension 30 mL  30 mL Oral Daily PRN Jesse Sans, MD      . menthol-cetylpyridinium  (CEPACOL) lozenge 3 mg  1 lozenge Oral PRN Jesse Sans, MD      . nicotine (NICODERM CQ - dosed in mg/24 hours) patch 14 mg  14 mg Transdermal Daily Jesse Sans, MD   14 mg at 02/16/21 0803  . pantoprazole (PROTONIX) EC tablet 40 mg  40 mg Oral BID Jesse Sans, MD   40 mg at 02/16/21 0803  . polyethylene glycol (MIRALAX / GLYCOLAX) packet 17 g  17 g Oral Daily PRN Jesse Sans, MD      . sertraline (ZOLOFT) tablet 50 mg  50 mg Oral Daily Clapacs, Jackquline Denmark, MD   50 mg at 02/16/21 0802  . traZODone (DESYREL) tablet 50 mg  50 mg Oral QHS PRN Jesse Sans, MD   50 mg at 02/13/21 2138  . Vitamin D3 (Vitamin D) tablet 1,000 Units  1,000 Units Oral Daily Clapacs, Jackquline Denmark, MD   1,000 Units at 02/16/21 0803    Lab Results: No results found for this or any previous visit (from the past 48 hour(s)).  Blood Alcohol level:  Lab Results  Component Value Date   ETH <10 02/10/2021    Metabolic Disorder Labs: No results found for: HGBA1C, MPG No results found for: PROLACTIN No results found for: CHOL, TRIG, HDL, CHOLHDL, VLDL, LDLCALC  Physical Findings: AIMS: Facial and Oral Movements Muscles of Facial Expression: None, normal Lips and Perioral Area: None, normal Jaw: None, normal Tongue: None, normal,Extremity Movements Upper (arms, wrists, hands, fingers): None, normal Lower (legs, knees, ankles, toes): None, normal, Trunk Movements Neck, shoulders, hips: None, normal, Overall Severity Severity of abnormal movements (highest score from questions above): None, normal Incapacitation due to abnormal movements: None, normal Patient's awareness of abnormal movements (rate only patient's report): No Awareness, Dental Status Current problems with teeth and/or dentures?: No Does patient usually wear dentures?: No  CIWA:    COWS:     Musculoskeletal: Strength & Muscle Tone: within normal limits Gait & Station: normal Patient leans: N/A  Psychiatric Specialty  Exam:  Presentation  General Appearance: Casual  Eye Contact:Good  Speech:Clear and Coherent; Normal Rate  Speech Volume:Normal  Handedness:Right   Mood and Affect  Mood:Euthymic  Affect:Congruent; Full Range   Thought Process  Thought Processes:Coherent  Descriptions of Associations:Intact  Orientation:Full (Time, Place and Person)  Thought Content:Logical  History of Schizophrenia/Schizoaffective disorder:No data recorded Duration of Psychotic Symptoms:No data recorded Hallucinations:Hallucinations: None  Ideas of Reference:None  Suicidal Thoughts:Suicidal Thoughts: No  Homicidal Thoughts:Homicidal Thoughts: No   Sensorium  Memory:Immediate Good; Remote Good; Recent Good  Judgment:Intact  Insight:Good   Executive Functions  Concentration:Good  Attention Span:Good  Recall:Good  Fund of Knowledge:Good  Language:Good  Psychomotor Activity  Psychomotor Activity:Psychomotor Activity: Normal   Assets  Assets:Communication Skills; Desire for Improvement; Leisure Time; Physical Health; Resilience; Social Support; Talents/Skills; Vocational/Educational; Transportation   Sleep  Sleep:Sleep: Good Number of Hours of Sleep: 8    Physical Exam: Physical Exam ROS Blood pressure 126/86, pulse 67, temperature 97.8 F (36.6 C), temperature source Oral, resp. rate 18, height 5\' 9"  (1.753 m), weight 59.8 kg, SpO2 97 %. Body mass index is 19.48 kg/m.   Treatment Plan Summary: Daily contact with patient to assess and evaluate symptoms and progress in treatment and Medication management 35 year old male with MDD and cocaine use disorder. Mood improving, no suicidal ideations. Continue Zoloft 50 mg daily. Supportive counseling and therapy provided today.   34, MD 02/16/2021, 2:33 PM

## 2021-02-16 NOTE — Plan of Care (Signed)
Patient states he is feeling a lot better and feels ready to leave   Problem: Education: Goal: Emotional status will improve Outcome: Progressing Goal: Mental status will improve Outcome: Progressing

## 2021-02-16 NOTE — Progress Notes (Signed)
Patient calm and cooperative during assessment denying SI/HI/AVH. Patient stated he feels ready for out patient therapy. Patient given education, support, and encouragement to be active in his treatment plan. Patient compliant with medication administration per MD orders. Pt observed by this Clinical research associate interacting appropriately with staff and peers on the unit. Pt being monitored Q 15 minutes for safety per unit protocol. Pt remains safe on the unit.

## 2021-02-17 ENCOUNTER — Other Ambulatory Visit: Payer: Self-pay

## 2021-02-17 DIAGNOSIS — F332 Major depressive disorder, recurrent severe without psychotic features: Secondary | ICD-10-CM | POA: Diagnosis not present

## 2021-02-17 MED ORDER — PANTOPRAZOLE SODIUM 40 MG PO TBEC: 40.0000 mg | DELAYED_RELEASE_TABLET | Freq: Two times a day (BID) | ORAL | 1 refills | Status: DC

## 2021-02-17 MED ORDER — VITAMIN D3 25 MCG PO TABS: 1000.0000 [IU] | ORAL_TABLET | Freq: Every day | ORAL | 1 refills | Status: DC

## 2021-02-17 MED ORDER — TRAZODONE HCL 50 MG PO TABS: 50.0000 mg | ORAL_TABLET | Freq: Every evening | ORAL | 1 refills | Status: DC | PRN

## 2021-02-17 MED ORDER — SERTRALINE HCL 50 MG PO TABS: 50.0000 mg | ORAL_TABLET | Freq: Every day | ORAL | 1 refills | Status: DC

## 2021-02-17 NOTE — Discharge Summary (Signed)
Physician Discharge Summary Note  Patient:  Kevin Stout is an 35 y.o., male MRN:  [id number] DOB:  1985/11/04 Patient phone:  [phone] (home)  Patient address:   [facility] [place] [address],  Total Time spent with patient: 35 minutes- 25 minutes face-to-face contact with patient, 10 minutes documentation, coordination of care, scripts   Date of Admission:  02/13/2021 Date of Discharge: 02/17/2021  Reason for Admission:  Worsening depression and suicide attempt via benadryl ingestion leading to intubation and ICU stay  Principal Problem: MDD (major depressive disorder), recurrent episode, severe (HCC) Discharge Diagnoses: Principal Problem:   MDD (major depressive disorder), recurrent episode, severe (HCC) Active Problems:   Vitamin D deficiency   Tobacco use disorder   Cocaine abuse (HCC)   Past Psychiatric History: Patient has a past history of depressive symptoms and says that he had 1 suicide attempt years ago. He says he has never seen a psychiatrist or been in a psychiatric hospital and had never been prescribed any psychiatric medicine.  He does have a history of substance abuse, mostly cocaine, also amphetamines and alcohol.  He has been through treatment programs in the past including the [facility] program when incarcerated.  He also has experience with Narcotics Anonymous.  Denies any history of psychotic disorder or violence.  Past Medical History: History reviewed. No pertinent past medical history.  Past Surgical History:  Procedure Laterality Date  . HERNIA REPAIR    . ORIF TIBIA FRACTURE     Family History: History reviewed. No pertinent family history. Family Psychiatric  History: Patient says there is a positive family history of alcohol abuse and also he had 1 aunt with completed suicide Social History:  Social History   Substance and Sexual Activity  Alcohol Use Yes   Comment: occ     Social History   Substance and Sexual Activity  Drug Use No     Social History   Socioeconomic History  . Marital status: Single    Spouse name: Not on file  . Number of children: Not on file  . Years of education: Not on file  . Highest education level: Not on file  Occupational History  . Not on file  Tobacco Use  . Smoking status: Current Every Day Smoker    Packs/day: 5.00    Types: Cigarettes  . Smokeless tobacco: Never Used  Substance and Sexual Activity  . Alcohol use: Yes    Comment: occ  . Drug use: No  . Sexual activity: Yes  Other Topics Concern  . Not on file  Social History Narrative  . Not on file   Social Determinants of Health   Financial Resource Strain: Not on file  Food Insecurity: Not on file  Transportation Needs: Not on file  Physical Activity: Not on file  Stress: Not on file  Social Connections: Not on file    Hospital Course:  35 year old man transferred to [place] from [place].  He had presented there after an overdose on diphenhydramine resulting in respiratory compromise, intubation, and ICU treatment. He was medically cleared at that time. He had a prolonged QTc of 497. Zoloft was started at modest dose of 50 mg daily given risk of further QTc prolongation. He had no side effects to medication. Mood gradually improved with sobriety, therapy, and group mileiu activities. He denies suicidal ideations, homicidal ideations, visual hallucinations, and auditory hallucinations. He is future oriented at this time aspiring to complete the [employer], seek employment, and find a  new place to live. He sites his mother and two children as main motivations to keep living. He also notes a fear of pain and repeat intubation as other strong motivators to not repeat a suicide attempt.     Physical Findings: AIMS: Facial and Oral Movements Muscles of Facial Expression: None, normal Lips and Perioral Area: None, normal Jaw: None, normal Tongue: None, normal,Extremity Movements Upper (arms, wrists, hands,  fingers): None, normal Lower (legs, knees, ankles, toes): None, normal, Trunk Movements Neck, shoulders, hips: None, normal, Overall Severity Severity of abnormal movements (highest score from questions above): None, normal Incapacitation due to abnormal movements: None, normal Patient's awareness of abnormal movements (rate only patient's report): No Awareness, Dental Status Current problems with teeth and/or dentures?: No Does patient usually wear dentures?: No  CIWA:    COWS:     Musculoskeletal: Strength & Muscle Tone: within normal limits Gait & Station: normal Patient leans: N/A   Psychiatric Specialty Exam: General Appearance: Casual  Eye Contact::  Good  Speech:  Clear and Coherent and Normal Rate  Volume:  Normal  Mood:  Euthymic  Affect:  Congruent  Thought Process:  Coherent and Linear  Orientation:  Full (Time, Place, and Person)  Thought Content:  Logical  Suicidal Thoughts:  No  Homicidal Thoughts:  No  Memory:  Immediate;   Good Recent;   Good Remote;   Good  Judgement:  Intact  Insight:  Good  Psychomotor Activity:  Normal  Concentration:  Fair  Recall:  Fair  Fund of Knowledge:Good  Language: Good  Akathisia:  Negative  Handed:  Right  AIMS (if indicated):     Assets:  Communication Skills Desire for Improvement Leisure Time Physical Health Resilience Social Support Talents/Skills Vocational/Educational  Sleep:  Number of Hours: 7.75  Cognition: WNL  ADL's:  Intact      Physical Exam: Physical Exam Vitals and nursing note reviewed.  Constitutional:      Appearance: Normal appearance.  HENT:     Head: Normocephalic and atraumatic.     Right Ear: External ear normal.     Left Ear: External ear normal.     Nose: Nose normal.     Mouth/Throat:     Mouth: Mucous membranes are moist.     Pharynx: Oropharynx is clear.  Eyes:     Extraocular Movements: Extraocular movements intact.     Conjunctiva/sclera: Conjunctivae normal.      Pupils: Pupils are equal, round, and reactive to light.  Cardiovascular:     Rate and Rhythm: Normal rate.     Pulses: Normal pulses.  Pulmonary:     Effort: Pulmonary effort is normal.     Breath sounds: Normal breath sounds.  Abdominal:     General: Abdomen is flat.     Palpations: Abdomen is soft.  Musculoskeletal:        General: No swelling. Normal range of motion.     Cervical back: Normal range of motion and neck supple.  Skin:    General: Skin is warm and dry.  Neurological:     General: No focal deficit present.     Mental Status: He is alert and oriented to person, place, and time.  Psychiatric:        Mood and Affect: Mood normal.        Behavior: Behavior normal.        Thought Content: Thought content normal.        Judgment: Judgment normal.   Review of Systems  Constitutional: Negative.   HENT: Negative.   Eyes: Negative.   Respiratory: Negative.   Cardiovascular: Negative.   Gastrointestinal: Negative.   Endocrine: Negative.   Genitourinary: Negative.   Musculoskeletal: Negative.   Skin: Negative.   Allergic/Immunologic: Positive for environmental allergies. Negative for food allergies.  Neurological: Negative.   Hematological: Negative.   Psychiatric/Behavioral: Negative for agitation, behavioral problems, confusion, dysphoric mood, hallucinations, self-injury, sleep disturbance and suicidal ideas. The patient is not nervous/anxious.    Blood pressure 116/86, pulse 71, temperature 98.3 F (36.8 C), resp. rate 17, height 5\' 9"  (1.753 m), weight 59.8 kg, SpO2 92 %. Body mass index is 19.48 kg/m.   Have you used any form of tobacco in the last 30 days? (Cigarettes, Smokeless Tobacco, Cigars, and/or Pipes): Yes  Has this patient used any form of tobacco in the last 30 days? (Cigarettes, Smokeless Tobacco, Cigars, and/or Pipes)  Yes, A prescription for an FDA-approved tobacco cessation medication was offered at discharge and the patient refused  Blood  Alcohol level:  Lab Results  Component Value Date   ETH <10 02/10/2021    Metabolic Disorder Labs:  No results found for: HGBA1C, MPG No results found for: PROLACTIN No results found for: CHOL, TRIG, HDL, CHOLHDL, VLDL, LDLCALC  See Psychiatric Specialty Exam and Suicide Risk Assessment completed by Attending Physician prior to discharge.  Discharge destination:  Other:  [facility]  Is patient on multiple antipsychotic therapies at discharge:  No   Has Patient had three or more failed trials of antipsychotic monotherapy by history:  No  Recommended Plan for Multiple Antipsychotic Therapies: NA  Discharge Instructions    Diet general   Complete by: As directed    Increase activity slowly   Complete by: As directed      Allergies as of 02/17/2021      Reactions   Vicodin Hp [hydrocodone-acetaminophen] Itching   Per 11/06/13 notes in computer from [facility]      Medication List    STOP taking these medications   Vitamin D (Ergocalciferol) 1.25 MG (50000 UNIT) Caps capsule Commonly known as: DRISDOL     TAKE these medications     Indication  feeding supplement Liqd Take 237 mLs by mouth 2 (two) times daily between meals.  Indication: nutritional support   nicotine 14 mg/24hr patch Commonly known as: NICODERM CQ - dosed in mg/24 hours Place 1 patch (14 mg total) onto the skin daily.  Indication: Nicotine Addiction   pantoprazole 40 MG tablet Commonly known as: PROTONIX Take 1 tablet (40 mg total) by mouth 2 (two) times daily. What changed: when to take this  Indication: Gastroesophageal Reflux Disease   polyethylene glycol 17 g packet Commonly known as: MIRALAX / GLYCOLAX Take 17 g by mouth daily as needed for moderate constipation.  Indication: Constipation   sertraline 50 MG tablet Commonly known as: ZOLOFT Take 1 tablet (50 mg total) by mouth daily. What changed:   medication strength  how much to take  Indication: Major Depressive  Disorder   traZODone 50 MG tablet Commonly known as: DESYREL Take 1 tablet (50 mg total) by mouth at bedtime as needed for sleep.  Indication: Trouble Sleeping   Vitamin D3 25 MCG tablet Commonly known as: Vitamin D Take 1 tablet (1,000 Units total) by mouth daily.  Indication: Vitamin D Deficiency       Follow-up Information    [facility] Follow up.   Specialty: Behavioral Health Why: Walk-in Monday through  Friday at 8:30AM. These are on a first come first served basis. Thanks! Contact information: [address] [place] [address] [phone]              Follow-up recommendations:  Activity:  as tolerated Diet:  regular diet  Comments:  7-day supply of medications provided to patient along with printed 30-day scripts with 1 refill  Signed: Jesse Sans, MD 02/17/2021, 9:43 AM

## 2021-02-17 NOTE — Progress Notes (Signed)
Pt denies SI, HI and AVH. Pt was educated on dc plan and verbalizes understanding. Pt received AVS, dc packet , medications and prescriptions. Pt also received clothes and shoes to wear when leaving. Torrie Mayers RN

## 2021-02-17 NOTE — Progress Notes (Signed)
  [facility] Adult Case Management Discharge Plan :  Will you be returning to the same living situation after discharge:  No. At discharge, do you have transportation home?: Yes,  CSW to arrange transportation. Do you have the ability to pay for your medications: No.  Release of information consent forms completed and in the chart;  Patient's signature needed at discharge.  Patient to Follow up at:  Follow-up Information    [facility] Follow up.   Specialty: Behavioral Health Why: Walk-in Monday through Friday at 8:30AM. These are on a first come first served basis. Thanks! Contact information: [address] [place] [address] [phone]              Next level of care provider has access to [facility] Link:no  Safety Planning and Suicide Prevention discussed: Yes,  SPE completed with Earma Reading, mother.  Have you used any form of tobacco in the last 30 days? (Cigarettes, Smokeless Tobacco, Cigars, and/or Pipes): Yes  Has patient been referred to the Quitline?: Patient refused referral  Patient has been referred for addiction treatment: Pt. refused referral  Glenis Smoker, LCSW 02/17/2021, 9:34 AM

## 2021-02-17 NOTE — BHH Suicide Risk Assessment (Signed)
[facility] Discharge Suicide Risk Assessment   Principal Problem: MDD (major depressive disorder), recurrent episode, severe (HCC) Discharge Diagnoses: Principal Problem:   MDD (major depressive disorder), recurrent episode, severe (HCC) Active Problems:   Vitamin D deficiency   Tobacco use disorder   Cocaine abuse (HCC)   Total Time spent with patient: 35 minutes- 25 minutes face-to-face contact with patient, 10 minutes documentation, coordination of care, scripts   Musculoskeletal: Strength & Muscle Tone: within normal limits Gait & Station: normal Patient leans: N/A  Psychiatric Specialty Exam: Review of Systems  Constitutional: Negative.   HENT: Negative.   Eyes: Negative.   Respiratory: Negative.   Cardiovascular: Negative.   Gastrointestinal: Negative.   Endocrine: Negative.   Genitourinary: Negative.   Musculoskeletal: Negative.   Skin: Negative.   Allergic/Immunologic: Positive for environmental allergies. Negative for food allergies.  Neurological: Negative.   Hematological: Negative.   Psychiatric/Behavioral: Negative for agitation, behavioral problems, confusion, dysphoric mood, hallucinations, self-injury, sleep disturbance and suicidal ideas. The patient is not nervous/anxious.     Blood pressure 116/86, pulse 71, temperature 98.3 F (36.8 C), resp. rate 17, height 5\' 9"  (1.753 m), weight 59.8 kg, SpO2 92 %.Body mass index is 19.48 kg/m.  General Appearance: Casual  Eye Contact::  Good  Speech:  Clear and Coherent and Normal Rate  Volume:  Normal  Mood:  Euthymic  Affect:  Congruent  Thought Process:  Coherent and Linear  Orientation:  Full (Time, Place, and Person)  Thought Content:  Logical  Suicidal Thoughts:  No  Homicidal Thoughts:  No  Memory:  Immediate;   Good Recent;   Good Remote;   Good  Judgement:  Intact  Insight:  Good  Psychomotor Activity:  Normal  Concentration:  Fair  Recall:  Fair  Fund of Knowledge:Good  Language: Good  Akathisia:   Negative  Handed:  Right  AIMS (if indicated):     Assets:  Communication Skills Desire for Improvement Leisure Time Physical Health Resilience Social Support Talents/Skills Vocational/Educational  Sleep:  Number of Hours: 7.75  Cognition: WNL  ADL's:  Intact   Mental Status Per Nursing Assessment::   On Admission:  Suicidal ideation indicated by patient,Suicidal ideation indicated by others,Self-harm behaviors  Demographic Factors:  Male and Caucasian  Loss Factors: Loss of significant relationship  Historical Factors: Prior suicide attempts  Risk Reduction Factors:   Responsible for children under 25 years of age, Sense of responsibility to family, Religious beliefs about death, Living with another person, especially a relative, Positive social support, Positive therapeutic relationship and Positive coping skills or problem solving skills  Continued Clinical Symptoms:  Depression:   Recent sense of peace/wellbeing Alcohol/Substance Abuse/Dependencies Previous Psychiatric Diagnoses and Treatments  Cognitive Features That Contribute To Risk:  None    Suicide Risk:  Mild:  Suicidal ideation of limited frequency, intensity, duration, and specificity.  There are no identifiable plans, no associated intent, mild dysphoria and related symptoms, good self-control (both objective and subjective assessment), few other risk factors, and identifiable protective factors, including available and accessible social support.   Follow-up Information    [facility] Follow up.   Specialty: Behavioral Health Why: Walk-in Monday through Friday at 8:30AM. These are on a first come first served basis. Thanks! Contact information: [address] [place] [phone]              Plan Of Care/Follow-up recommendations:  Activity:  as tolerated Diet:  regular diet  [phone],  MD 02/17/2021, 9:40 AM

## 2021-02-17 NOTE — Plan of Care (Signed)
  Pt denies depression, anxiety, SI, HI and AVH. Pt was educated on care plan and verbalizes understanding. Kevin Mayers RN Problem: Education: Goal: Knowledge of [facility] Education information/materials will improve Outcome: Adequate for Discharge Goal: Emotional status will improve Outcome: Adequate for Discharge Goal: Mental status will improve Outcome: Adequate for Discharge Goal: Verbalization of understanding the information provided will improve Outcome: Adequate for Discharge   Problem: Activity: Goal: Interest or engagement in activities will improve Outcome: Adequate for Discharge Goal: Sleeping patterns will improve Outcome: Adequate for Discharge   Problem: Coping: Goal: Ability to verbalize frustrations and anger appropriately will improve Outcome: Adequate for Discharge Goal: Ability to demonstrate self-control will improve Outcome: Adequate for Discharge   Problem: Health Behavior/Discharge Planning: Goal: Identification of resources available to assist in meeting health care needs will improve Outcome: Adequate for Discharge Goal: Compliance with treatment plan for underlying cause of condition will improve Outcome: Adequate for Discharge   Problem: Physical Regulation: Goal: Ability to maintain clinical measurements within normal limits will improve Outcome: Adequate for Discharge   Problem: Safety: Goal: Periods of time without injury will increase Outcome: Adequate for Discharge   Problem: Education: Goal: Ability to make informed decisions regarding treatment will improve Outcome: Adequate for Discharge   Problem: Coping: Goal: Coping ability will improve Outcome: Adequate for Discharge   Problem: Health Behavior/Discharge Planning: Goal: Identification of resources available to assist in meeting health care needs will improve Outcome: Adequate for Discharge   Problem: Medication: Goal: Compliance with prescribed medication regimen will  improve Outcome: Adequate for Discharge   Problem: Self-Concept: Goal: Ability to disclose and discuss suicidal ideas will improve Outcome: Adequate for Discharge Goal: Will verbalize positive feelings about self Outcome: Adequate for Discharge

## 2021-12-07 ENCOUNTER — Ambulatory Visit: Payer: Self-pay | Admitting: Physician Assistant

## 2021-12-07 ENCOUNTER — Other Ambulatory Visit: Payer: Self-pay

## 2021-12-07 VITALS — BP 131/77 | HR 98 | Temp 98.6°F | Resp 18 | Ht 70.0 in | Wt 143.0 lb

## 2021-12-07 DIAGNOSIS — F3340 Major depressive disorder, recurrent, in remission, unspecified: Secondary | ICD-10-CM

## 2021-12-07 DIAGNOSIS — E559 Vitamin D deficiency, unspecified: Secondary | ICD-10-CM

## 2021-12-07 DIAGNOSIS — K029 Dental caries, unspecified: Secondary | ICD-10-CM

## 2021-12-07 DIAGNOSIS — F1290 Cannabis use, unspecified, uncomplicated: Secondary | ICD-10-CM

## 2021-12-07 DIAGNOSIS — F17200 Nicotine dependence, unspecified, uncomplicated: Secondary | ICD-10-CM

## 2021-12-07 DIAGNOSIS — F1510 Other stimulant abuse, uncomplicated: Secondary | ICD-10-CM

## 2021-12-07 DIAGNOSIS — Z1159 Encounter for screening for other viral diseases: Secondary | ICD-10-CM

## 2021-12-07 DIAGNOSIS — Z114 Encounter for screening for human immunodeficiency virus [HIV]: Secondary | ICD-10-CM

## 2021-12-07 DIAGNOSIS — Z9189 Other specified personal risk factors, not elsewhere classified: Secondary | ICD-10-CM

## 2021-12-07 DIAGNOSIS — F1410 Cocaine abuse, uncomplicated: Secondary | ICD-10-CM

## 2021-12-07 DIAGNOSIS — Z111 Encounter for screening for respiratory tuberculosis: Secondary | ICD-10-CM

## 2021-12-07 NOTE — Progress Notes (Signed)
Patient has eaten today. Patient took medication yesterday. Patient reports multiple teeth causing pain.

## 2021-12-07 NOTE — Patient Instructions (Signed)
We will call you with today's lab results  Please let us know if there is anything else we can do for you.  Roney Jaffe, PA-C Physician Assistant [facility] [email or web address]   Dental Caries, Adult Dental caries or cavities are areas of decay in the outer layers (enamel and dentin) of your tooth. When you eat or drink sugary foods and liquids, the natural bacteria in your mouth break down those sugars and produce a lot of acids. The acids destroy the protective layer of your tooth, leading to tooth decay. It is important to treat your tooth decay as soon as possible. Untreated dental caries can spread decay and may lead to a painful infection. Keeping your mouth clean (good oral hygiene) by brushing regularly with fluoride toothpaste, flossing, and getting regular dental checkups can help reduce the bacteria and prevent dental caries. What are the causes? Dental caries are caused by the acid that is produced when bacteria in your mouth break down sugary foods and liquids. What increases the risk? This condition is more likely to develop in people who: Drink a lot of sugary liquids, including alcoholic drinks, such as champagne. Eat a lot of sweets and carbohydrates. Drink water that is not treated with fluoride. Have poor oral hygiene. Have deep grooves in their teeth. Take certain medicines that decrease saliva. What are the signs or symptoms? Symptoms of dental caries include: White, brown, or black spots on the teeth. Pain as the decay progresses. Swelling or bleeding in the gums. How is this diagnosed? This condition may be diagnosed based on: Your signs and symptoms. Oral exams. This includes probing the hardness of the tooth with an instrument called a dental explorer. Dental X-rays to look for dental caries between teeth. This is also used to confirm the diagnosis. Sometimes special lights, dyes, or probes, which use  electrical conductivity or laser reflection, can assist in finding dental caries. How is this treated? Treatment for dental caries usually involves a procedure to remove the decay and restore the tooth. Restoring the tooth using a filling can be done in the dentist's office. More complex restorations can be created in a lab. Follow these instructions at home:  Practice good oral hygiene. This keeps your mouth and gums healthy. Use a fluoride-containing toothpaste to brush your teeth twice a day. Floss once a day. If your dental caries have caused an infection, you may be given an antibiotic medicine. Take it as told by your dentist. Do not stop taking the antibiotic even if you start to feel better. Keep all follow-up visits as told by your dentist. This is important. Follow-up visits include regular cleanings. You will be told how often this needs to be done. How is this prevented? To prevent dental caries: Brush your teeth every morning and night with fluoride toothpaste. Floss your teeth once a day. Get regular dental cleanings. If told by your dentist, wash your mouth with prescription mouthwash (chlorhexidine) and apply topical fluoride to your teeth. Drink water that has fluoride added to it. Drink water instead of sugary drinks. Eat healthy meals and snacks. If prescribed by your dentist, have additional in-office fluoride treatments and sealants placed on your teeth. Contact a health care provider if: You have symptoms of tooth decay. Summary Dental caries or cavities are areas of decay in the outer layers of your tooth. It is important to treat your tooth decay as soon as possible. This condition is caused by the acid that is produced when  bacteria in your mouth break down sugary foods and liquids. To prevent this condition, practice good oral hygiene. This keeps your mouth and gums healthy by brushing and flossing. Use fluoride toothpaste. Take an antibiotic to treat an infection,  if told by your dentist. Do not stop taking the antibiotic even if your condition gets better. Have regular dental cleanings and keep all follow-up visits. This information is not intended to replace advice given to you by your health care provider. Make sure you discuss any questions you have with your health care provider. Document Revised: 10/05/2019 Document Reviewed: 10/05/2019 Elsevier Patient Education  2022 [employer].

## 2021-12-07 NOTE — Progress Notes (Signed)
New Patient Office Visit  Subjective:  Patient ID: Kevin Stout, male    DOB: [birth year]  Age: 36 y.o. MRN: [id number]  CC:  Chief Complaint  Patient presents with   Annual Exam    Transition Paperwork    HPI Kevin Stout reports that he is being treated for substance abuse at [facility] residential treatment center.  States that he arrived November 23, 2021.  States that he does plan on going to long-term care in [facility], states that he is awaiting placement.  States that his moods are stable, appetite is good sleep is good.   Does complain of dental pain, states that he has 2 different teeth that are broken one on the bottom left one on the bottom right, states that they are sensitive to cold and sweet.  States that ibuprofen does offer relief.  States that he has not been able to follow-up with dentistry due to financial constraints.   History reviewed. No pertinent past medical history.  Past Surgical History:  Procedure Laterality Date   HERNIA REPAIR     ORIF TIBIA FRACTURE      History reviewed. No pertinent family history.  Social History   Socioeconomic History   Marital status: Single    Spouse name: Not on file   Number of children: Not on file   Years of education: Not on file   Highest education level: Not on file  Occupational History   Not on file  Tobacco Use   Smoking status: Former    Packs/day: 5.00    Types: Cigarettes   Smokeless tobacco: Never  Substance and Sexual Activity   Alcohol use: Yes    Comment: occ   Drug use: No   Sexual activity: Yes  Other Topics Concern   Not on file  Social History Narrative   Not on file   Social Determinants of Health   Financial Resource Strain: Not on file  Food Insecurity: Not on file  Transportation Needs: Not on file  Physical Activity: Not on file  Stress: Not on file  Social Connections: Not on file  Intimate Partner Violence: Not on file    ROS Review of Systems   Constitutional:  Negative for chills and fever.  HENT:  Positive for dental problem.   Eyes: Negative.   Respiratory:  Negative for shortness of breath.   Cardiovascular:  Negative for chest pain.  Gastrointestinal: Negative.   Endocrine: Negative.   Genitourinary: Negative.   Musculoskeletal: Negative.   Skin: Negative.   Allergic/Immunologic: Negative.   Neurological: Negative.   Hematological: Negative.   Psychiatric/Behavioral:  Negative for dysphoric mood, self-injury, sleep disturbance and suicidal ideas. The patient is not nervous/anxious.    Objective:   Today's Vitals: BP 131/77 (BP Location: Left Arm, Patient Position: Sitting, Cuff Size: Normal)    Pulse 98    Temp 98.6 F (37 C) (Oral)    Resp 18    Ht 5\' 10"  (1.778 m)    Wt 143 lb (64.9 kg)    SpO2 100%    BMI 20.52 kg/m   Physical Exam Vitals and nursing note reviewed.  Constitutional:      Appearance: Normal appearance.  HENT:     Head: Normocephalic and atraumatic.     Right Ear: External ear normal.     Left Ear: External ear normal.     Nose: Nose normal.     Mouth/Throat:     Lips: Pink.  Mouth: Mucous membranes are moist.     Dentition: Abnormal dentition. Dental caries present. No gingival swelling or dental abscesses.  Eyes:     Extraocular Movements: Extraocular movements intact.     Conjunctiva/sclera: Conjunctivae normal.     Pupils: Pupils are equal, round, and reactive to light.  Cardiovascular:     Rate and Rhythm: Normal rate and regular rhythm.     Pulses: Normal pulses.     Heart sounds: Normal heart sounds.  Pulmonary:     Effort: Pulmonary effort is normal.     Breath sounds: Normal breath sounds.  Musculoskeletal:        General: Normal range of motion.     Cervical back: Normal range of motion and neck supple.  Skin:    General: Skin is warm and dry.  Neurological:     General: No focal deficit present.     Mental Status: He is alert and oriented to person, place, and time.   Psychiatric:        Mood and Affect: Mood normal.        Behavior: Behavior normal.        Thought Content: Thought content normal.        Judgment: Judgment normal.    Assessment & Plan:   Problem List Items Addressed This Visit       Other   Major depression - Primary   Vitamin D deficiency   Relevant Orders   Vitamin D, 25-hydroxy (Completed)   Tobacco use disorder   Marijuana use   Amphetamine use disorder, mild (HCC)   Cocaine abuse (HCC)   Other Visit Diagnoses     Screening for HIV (human immunodeficiency virus)       Relevant Orders   HIV antibody (with reflex) (Completed)   Encounter for HCV screening test for high risk patient       Relevant Orders   HCV Ab w Reflex to Quant PCR (Completed)   Dental caries       Encounter for PPD test       Relevant Orders   PPD (Completed)       Outpatient Encounter Medications as of 12/07/2021  Medication Sig   [DISCONTINUED] pantoprazole (PROTONIX) 40 MG tablet Take 1 tablet (40 mg total) by mouth 2 (two) times daily.   [DISCONTINUED] polyethylene glycol (MIRALAX / GLYCOLAX) 17 g packet Take 17 g by mouth daily as needed for moderate constipation.   [DISCONTINUED] sertraline (ZOLOFT) 50 MG tablet Take 1 tablet (50 mg total) by mouth daily.   [DISCONTINUED] traZODone (DESYREL) 50 MG tablet Take 1 tablet (50 mg total) by mouth at bedtime as needed for sleep.   [DISCONTINUED] Vitamin D3 (VITAMIN D) 25 MCG tablet Take 1 tablet (1,000 Units total) by mouth daily.   [DISCONTINUED] feeding supplement (ENSURE ENLIVE / ENSURE PLUS) LIQD Take 237 mLs by mouth 2 (two) times daily between meals. (Patient not taking: Reported on 12/07/2021)   [DISCONTINUED] nicotine (NICODERM CQ - DOSED IN MG/24 HOURS) 14 mg/24hr patch Place 1 patch (14 mg total) onto the skin daily. (Patient not taking: Reported on 12/07/2021)   No facility-administered encounter medications on file as of 12/07/2021.   1. Recurrent major depressive disorder, in remission  [facility]) Patient no longer taking any behavioral health medications, patient does not want to continue lifestyle modifications and coping skills.  Patient encouraged to maintain follow-up with medical provider during long-term care, patient encouraged to return to mobile unit if needed.  Patient understands  and agrees.  Red flags given for prompt reevaluation.  2. Vitamin D deficiency  - Vitamin D, 25-hydroxy  3. Dental caries Patient education given on supportive care, encouraged to follow-up with dentistry when able.  4. Amphetamine use disorder, mild (HCC) Currently in substance abuse treatment program  5. Cocaine abuse (HCC)   6. Tobacco use disorder   7. Marijuana use   8. Screening for HIV (human immunodeficiency virus)  - HIV antibody (with reflex)  9. Encounter for HCV screening test for high risk patient  - HCV Ab w Reflex to Quant PCR  10. Encounter for PPD test  - PPD    I have reviewed the patient's medical history (PMH, PSH, Social History, Family History, Medications, and allergies) , and have been updated if relevant. I spent 30 minutes reviewing chart and  face to face time with patient.    Follow-up: Return if symptoms worsen or fail to improve.   Kasandra Knudsen Mayers, PA-C

## 2021-12-08 LAB — HCV AB W REFLEX TO QUANT PCR: HCV Ab: <0.1 s/co ratio (ref 0.0–0.9)

## 2021-12-08 LAB — HCV INTERPRETATION

## 2021-12-08 LAB — HIV ANTIBODY (ROUTINE TESTING W REFLEX): HIV Screen 4th Generation wRfx: NONREACTIVE

## 2021-12-08 LAB — VITAMIN D 25 HYDROXY (VIT D DEFICIENCY, FRACTURES): Vit D, 25-Hydroxy: 11.5 ng/mL — ABNORMAL LOW (ref 30.0–100.0)

## 2021-12-09 DIAGNOSIS — K029 Dental caries, unspecified: Secondary | ICD-10-CM | POA: Insufficient documentation

## 2021-12-09 MED ORDER — VITAMIN D (ERGOCALCIFEROL) 1.25 MG (50000 UNIT) PO CAPS: 50000.0000 [IU] | ORAL_CAPSULE | ORAL | 2 refills | Status: AC

## 2021-12-09 NOTE — Addendum Note (Signed)
Addended by: Roney Jaffe on: 12/09/2021 08:47 AM   Modules accepted: Orders

## 2021-12-10 ENCOUNTER — Telehealth: Payer: Self-pay | Admitting: *Deleted

## 2021-12-10 NOTE — Telephone Encounter (Signed)
MA spoke with RN and shared patients results with recheck of vitamin d being completed in 3 months.

## 2021-12-10 NOTE — Telephone Encounter (Signed)
-----   Message from Roney Jaffe, [place] sent at 12/09/2021  8:47 AM EST ----- Please call patient and let him know that his vitamin D levels are low, he needs to take 50,000 units once a week for the next 12 weeks.  Prescription sent to his pharmacy.  His screening for hepatitis C and HIV were negative.  Patient gave verbal consent to this provider that it is okay to give results to Keelyn Fjelstad his mother, number listed in his chart.  Patient does not currently have a working phone.

## 2022-04-28 IMAGING — CT CT HEAD W/O CM
3 series · 16 of 47 positions shown, 19 images · non-contrast
Comparison: 11/07/2013

CLINICAL DATA: Altered level of consciousness, Benadryl overdose

EXAM:
CT HEAD WITHOUT CONTRAST
TECHNIQUE: Contiguous axial images were obtained from the base of the skull
through the vertex without intravenous contrast.

[Series 2: head wo · axial · 0.47mm/px · z∈[-190,-40]mm · 10 of 36 slices shown, 13 images]
[im 3/36  brain]
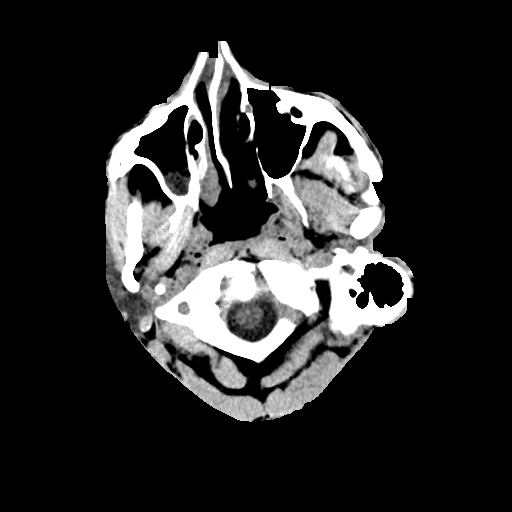
[im 3/36  bone]
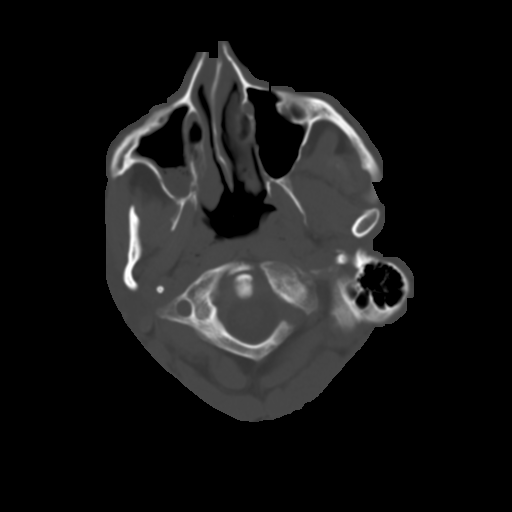
[im 7/36  brain]
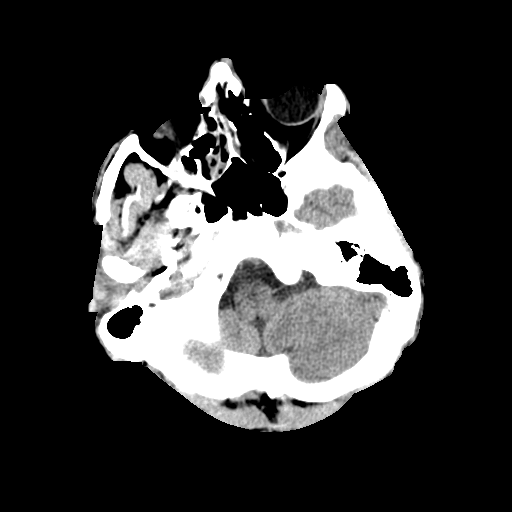
[im 10/36  brain]
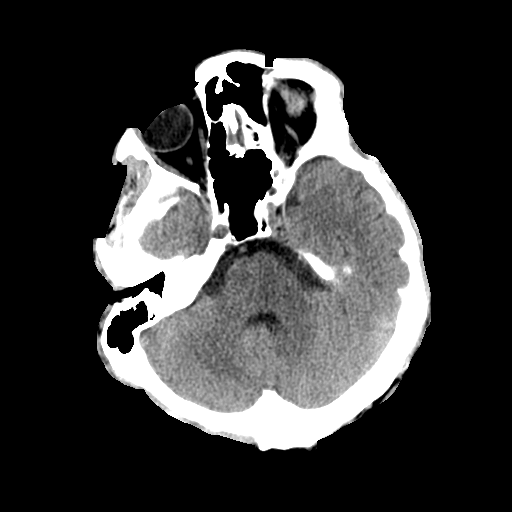
[im 13/36  brain]
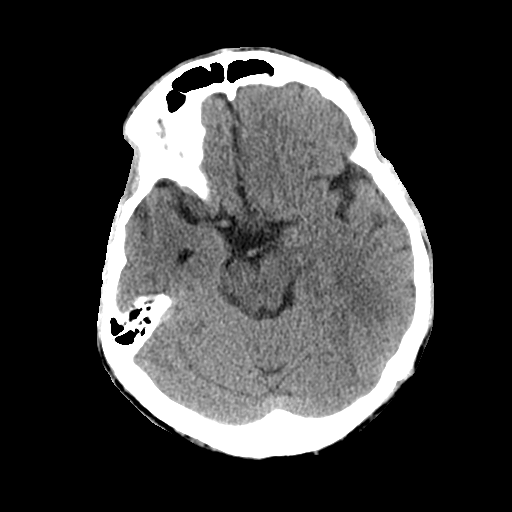
[im 16/36  brain]
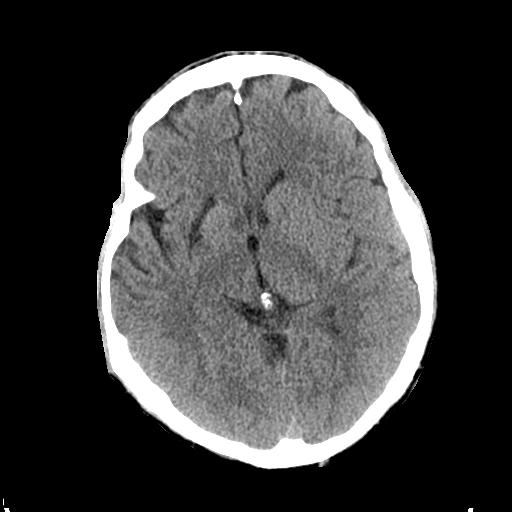
[im 16/36  bone]
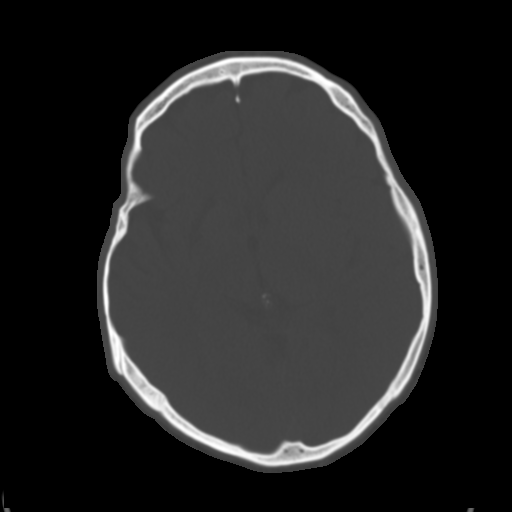
[im 20/36  brain]
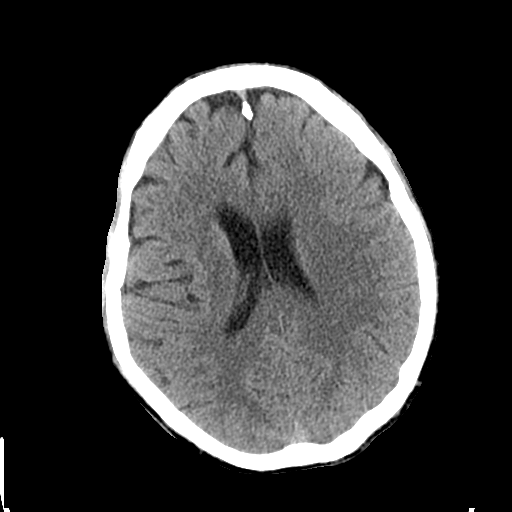
[im 23/36  brain]
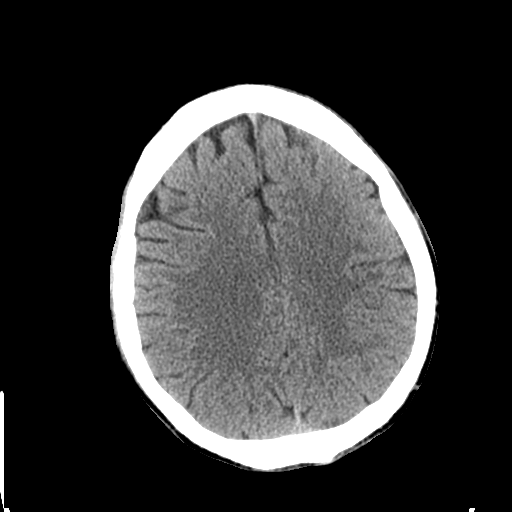
[im 27/36  brain]
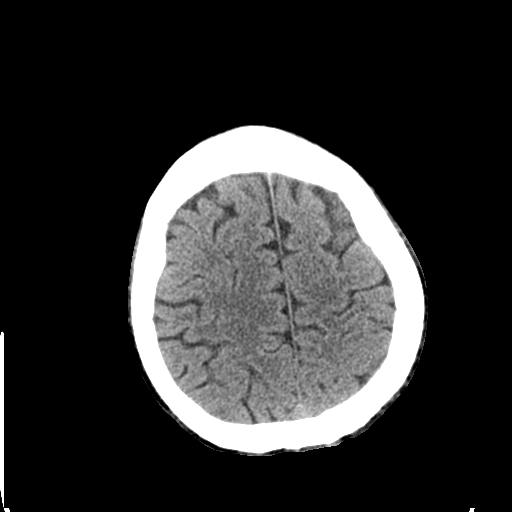
[im 29/36  brain]
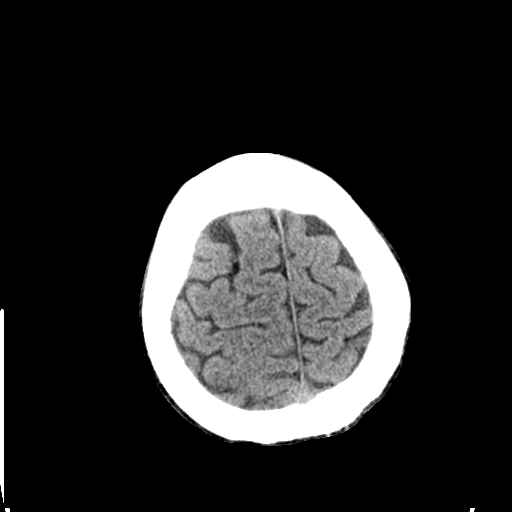
[im 29/36  bone]
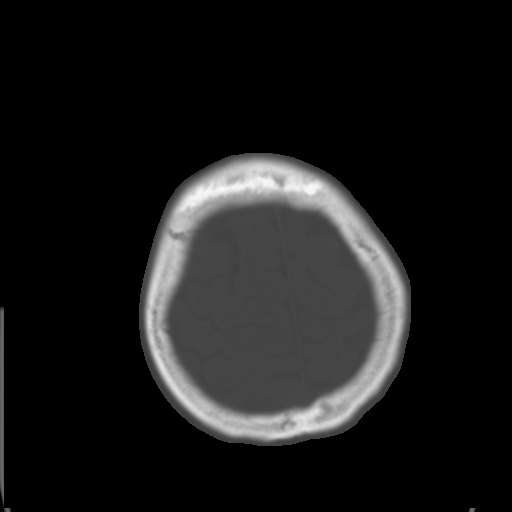
[im 33/36  brain]
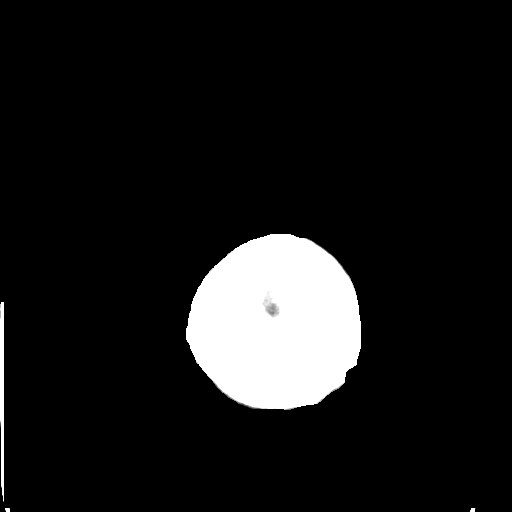

[Series 5: coronal soft tissue · coronal · 0.34mm/px · 3 of 71 slices shown]
[im 26/71  brain]
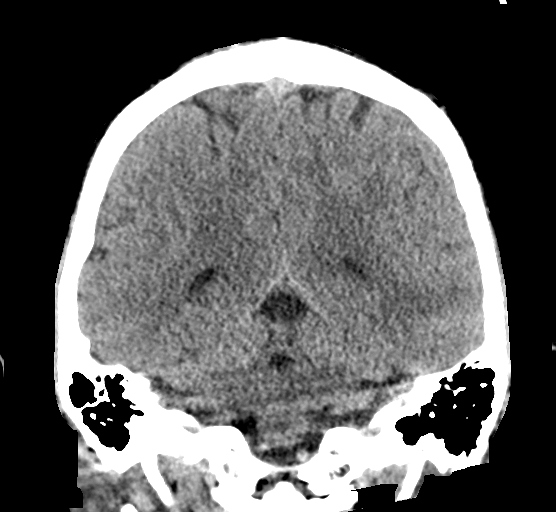
[im 32/71  brain]
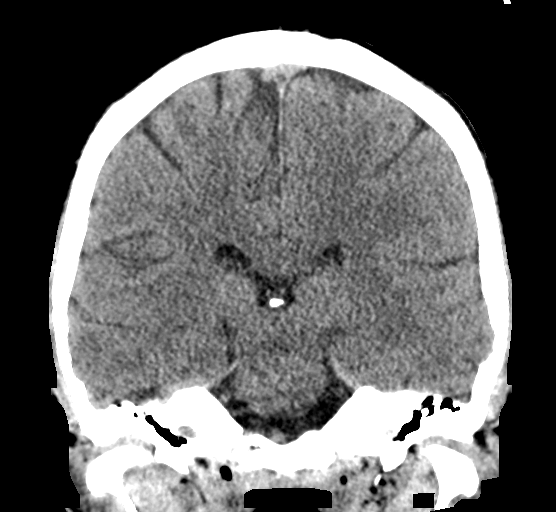
[im 39/71  brain]
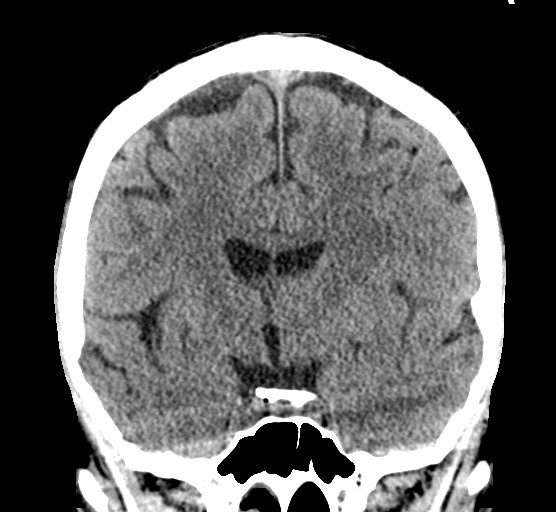

[Series 6: sagittal soft tissue · sagittal · 0.34mm/px · 3 of 64 slices shown]
[im 26/64  brain]
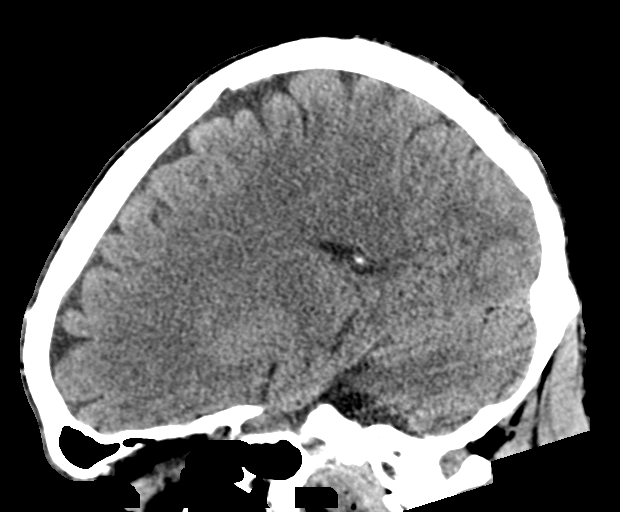
[im 32/64  brain]
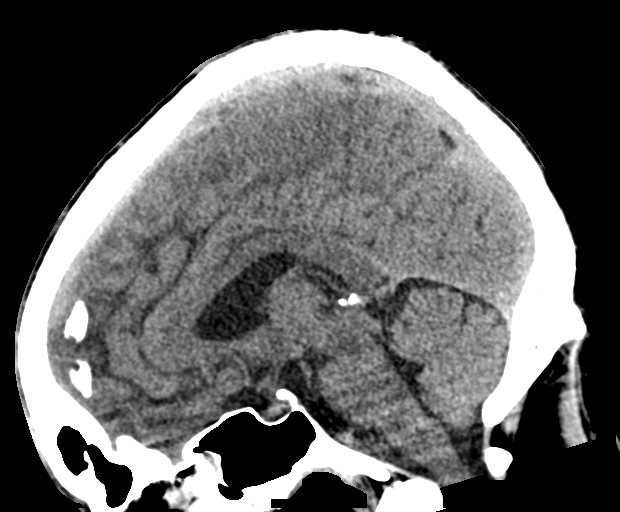
[im 39/64  brain]
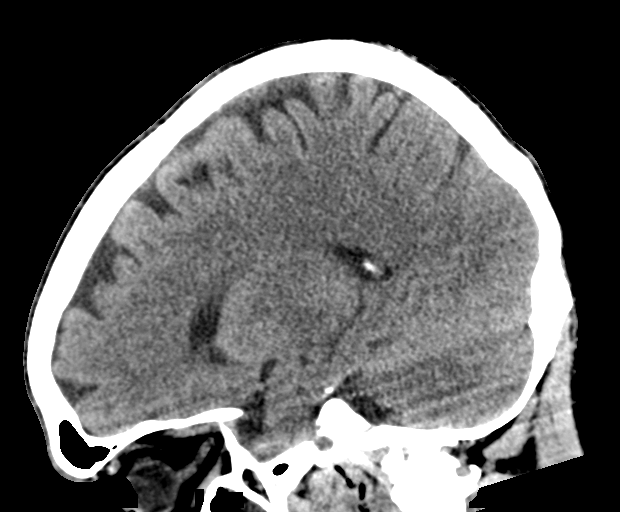

[16 of 47 positions shown; findings below may reference images not displayed]

FINDINGS: Brain: No acute infarct or hemorrhage. Encephalomalacia within the
right basal ganglia could be related to prior infarct or hemorrhage.
Lateral ventricles and remaining midline structures are
unremarkable. There are no acute extra-axial fluid collections. No
mass effect.

Vascular: No hyperdense vessel or unexpected calcification.

Skull: Normal. Negative for fracture or focal lesion.

Sinuses/Orbits: Mucosal thickening within the right maxillary sinus,
with superimposed gas fluid level. Mild mucosal thickening of the
ethmoid air cells.

Other: None.
IMPRESSION: 1. No acute intracranial process.
2. Chronic encephalomalacia right basal ganglia consistent with
previous infarct or hemorrhage.
3. Right ethmoid and maxillary sinus disease.

## 2022-04-28 IMAGING — DX DG ABD PORTABLE 1V
1 series · 1 of 1 positions shown · non-contrast
Comparison: None.

CLINICAL DATA: NG tube placement

EXAM:
PORTABLE ABDOMEN - 1 VIEW

[abdomen kub]
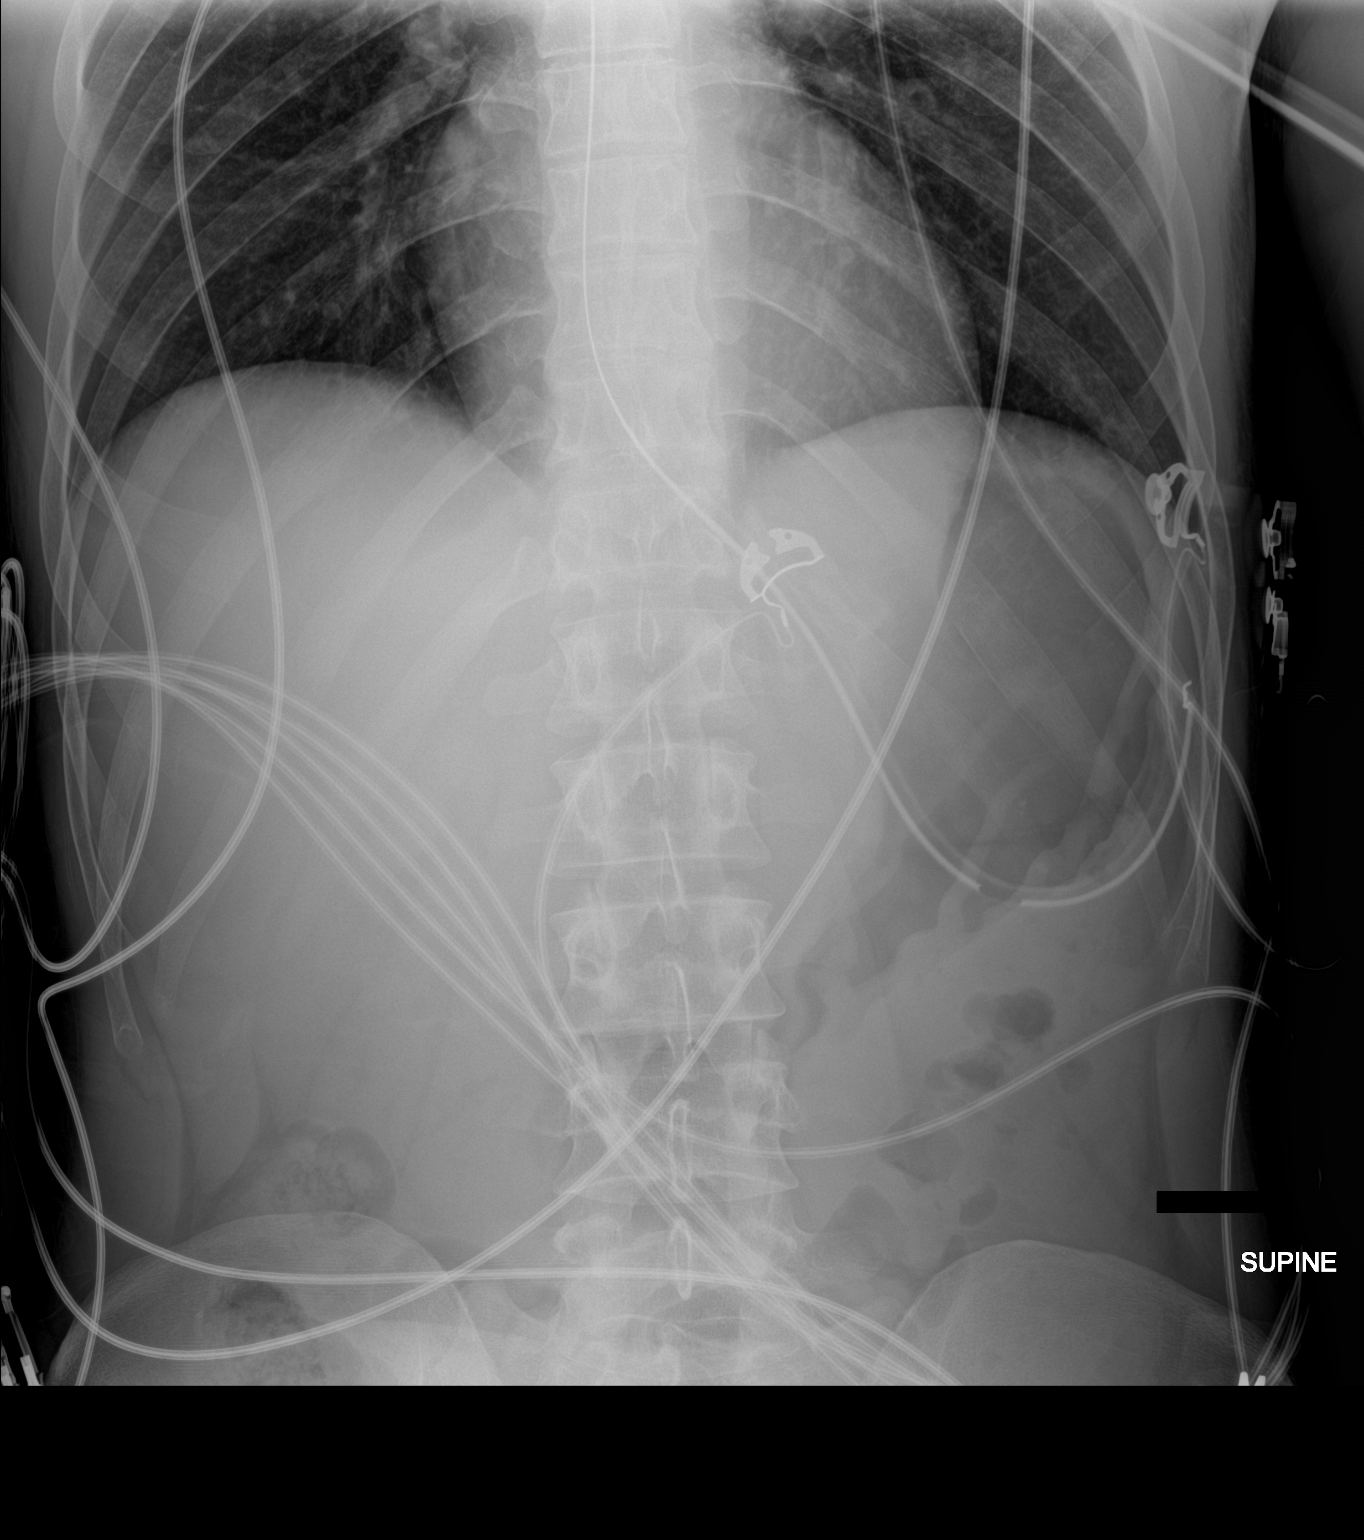

[1 of 1 positions shown; findings below may reference images not displayed]

FINDINGS: Esophageal tube tip and side port overlie the mid stomach. Upper gas
pattern is unremarkable
IMPRESSION: Esophageal tube tip overlies the mid stomach.
